# Patient Record
Sex: Male | Born: 1955 | Race: Black or African American | Hispanic: No | Marital: Single | Smoking: Current every day smoker
Health system: Southern US, Community
[De-identification: ages and names within clinical notes are randomized; demographics above are authoritative.]

## PROBLEM LIST (undated history)

## (undated) DIAGNOSIS — M1990 Unspecified osteoarthritis, unspecified site: Secondary | ICD-10-CM

## (undated) DIAGNOSIS — R011 Cardiac murmur, unspecified: Secondary | ICD-10-CM

## (undated) DIAGNOSIS — I10 Essential (primary) hypertension: Secondary | ICD-10-CM

## (undated) DIAGNOSIS — R001 Bradycardia, unspecified: Secondary | ICD-10-CM

## (undated) DIAGNOSIS — D497 Neoplasm of unspecified behavior of endocrine glands and other parts of nervous system: Secondary | ICD-10-CM

## (undated) HISTORY — PX: HERNIA REPAIR: SHX51

---

## 1997-12-16 ENCOUNTER — Emergency Department (HOSPITAL_COMMUNITY): Admission: EM | Admit: 1997-12-16 | Discharge: 1997-12-16 | Payer: Self-pay | Admitting: Emergency Medicine

## 1998-03-04 ENCOUNTER — Emergency Department (HOSPITAL_COMMUNITY): Admission: EM | Admit: 1998-03-04 | Discharge: 1998-03-04 | Payer: Self-pay | Admitting: Emergency Medicine

## 1998-03-04 ENCOUNTER — Encounter: Payer: Self-pay | Admitting: Emergency Medicine

## 1998-09-13 ENCOUNTER — Emergency Department (HOSPITAL_COMMUNITY): Admission: EM | Admit: 1998-09-13 | Discharge: 1998-09-13 | Payer: Self-pay | Admitting: Emergency Medicine

## 1999-02-17 ENCOUNTER — Emergency Department (HOSPITAL_COMMUNITY): Admission: EM | Admit: 1999-02-17 | Discharge: 1999-02-17 | Payer: Self-pay | Admitting: Emergency Medicine

## 1999-04-05 ENCOUNTER — Emergency Department (HOSPITAL_COMMUNITY): Admission: EM | Admit: 1999-04-05 | Discharge: 1999-04-05 | Payer: Self-pay | Admitting: Emergency Medicine

## 1999-05-17 ENCOUNTER — Emergency Department (HOSPITAL_COMMUNITY): Admission: EM | Admit: 1999-05-17 | Discharge: 1999-05-17 | Payer: Self-pay | Admitting: Internal Medicine

## 1999-08-30 ENCOUNTER — Emergency Department (HOSPITAL_COMMUNITY): Admission: EM | Admit: 1999-08-30 | Discharge: 1999-08-30 | Payer: Self-pay | Admitting: Emergency Medicine

## 1999-10-23 ENCOUNTER — Emergency Department (HOSPITAL_COMMUNITY): Admission: EM | Admit: 1999-10-23 | Discharge: 1999-10-23 | Payer: Self-pay | Admitting: Emergency Medicine

## 1999-12-07 ENCOUNTER — Encounter (INDEPENDENT_AMBULATORY_CARE_PROVIDER_SITE_OTHER): Payer: Self-pay | Admitting: Specialist

## 1999-12-07 ENCOUNTER — Ambulatory Visit (HOSPITAL_COMMUNITY): Admission: RE | Admit: 1999-12-07 | Discharge: 1999-12-07 | Payer: Self-pay | Admitting: Gastroenterology

## 1999-12-09 ENCOUNTER — Emergency Department (HOSPITAL_COMMUNITY): Admission: EM | Admit: 1999-12-09 | Discharge: 1999-12-09 | Payer: Self-pay | Admitting: Emergency Medicine

## 2000-02-22 ENCOUNTER — Emergency Department (HOSPITAL_COMMUNITY): Admission: EM | Admit: 2000-02-22 | Discharge: 2000-02-22 | Payer: Self-pay | Admitting: Emergency Medicine

## 2001-01-08 ENCOUNTER — Emergency Department (HOSPITAL_COMMUNITY): Admission: EM | Admit: 2001-01-08 | Discharge: 2001-01-08 | Payer: Self-pay | Admitting: Emergency Medicine

## 2001-02-13 ENCOUNTER — Emergency Department (HOSPITAL_COMMUNITY): Admission: EM | Admit: 2001-02-13 | Discharge: 2001-02-13 | Payer: Self-pay | Admitting: *Deleted

## 2001-11-04 ENCOUNTER — Emergency Department (HOSPITAL_COMMUNITY): Admission: EM | Admit: 2001-11-04 | Discharge: 2001-11-04 | Payer: Self-pay | Admitting: Emergency Medicine

## 2001-11-12 ENCOUNTER — Emergency Department (HOSPITAL_COMMUNITY): Admission: EM | Admit: 2001-11-12 | Discharge: 2001-11-12 | Payer: Self-pay | Admitting: *Deleted

## 2002-01-08 ENCOUNTER — Emergency Department (HOSPITAL_COMMUNITY): Admission: EM | Admit: 2002-01-08 | Discharge: 2002-01-08 | Payer: Self-pay | Admitting: Emergency Medicine

## 2002-01-13 ENCOUNTER — Encounter: Payer: Self-pay | Admitting: Emergency Medicine

## 2002-01-13 ENCOUNTER — Emergency Department (HOSPITAL_COMMUNITY): Admission: EM | Admit: 2002-01-13 | Discharge: 2002-01-13 | Payer: Self-pay | Admitting: Emergency Medicine

## 2002-02-19 ENCOUNTER — Emergency Department (HOSPITAL_COMMUNITY): Admission: EM | Admit: 2002-02-19 | Discharge: 2002-02-20 | Payer: Self-pay | Admitting: Emergency Medicine

## 2002-02-20 ENCOUNTER — Encounter: Payer: Self-pay | Admitting: *Deleted

## 2002-04-24 ENCOUNTER — Emergency Department (HOSPITAL_COMMUNITY): Admission: EM | Admit: 2002-04-24 | Discharge: 2002-04-24 | Payer: Self-pay | Admitting: Emergency Medicine

## 2002-07-16 ENCOUNTER — Emergency Department (HOSPITAL_COMMUNITY): Admission: EM | Admit: 2002-07-16 | Discharge: 2002-07-16 | Payer: Self-pay | Admitting: Emergency Medicine

## 2002-07-16 ENCOUNTER — Encounter: Payer: Self-pay | Admitting: Emergency Medicine

## 2002-08-05 ENCOUNTER — Emergency Department (HOSPITAL_COMMUNITY): Admission: EM | Admit: 2002-08-05 | Discharge: 2002-08-05 | Payer: Self-pay | Admitting: Emergency Medicine

## 2002-08-05 ENCOUNTER — Encounter: Payer: Self-pay | Admitting: Emergency Medicine

## 2002-08-06 ENCOUNTER — Emergency Department (HOSPITAL_COMMUNITY): Admission: EM | Admit: 2002-08-06 | Discharge: 2002-08-06 | Payer: Self-pay | Admitting: Emergency Medicine

## 2002-09-26 ENCOUNTER — Emergency Department (HOSPITAL_COMMUNITY): Admission: EM | Admit: 2002-09-26 | Discharge: 2002-09-26 | Payer: Self-pay | Admitting: Emergency Medicine

## 2003-01-08 ENCOUNTER — Emergency Department (HOSPITAL_COMMUNITY): Admission: EM | Admit: 2003-01-08 | Discharge: 2003-01-08 | Payer: Self-pay | Admitting: Emergency Medicine

## 2003-03-06 ENCOUNTER — Emergency Department (HOSPITAL_COMMUNITY): Admission: EM | Admit: 2003-03-06 | Discharge: 2003-03-06 | Payer: Self-pay | Admitting: *Deleted

## 2003-03-30 ENCOUNTER — Emergency Department (HOSPITAL_COMMUNITY): Admission: AD | Admit: 2003-03-30 | Discharge: 2003-03-30 | Payer: Self-pay | Admitting: Family Medicine

## 2003-04-24 ENCOUNTER — Emergency Department (HOSPITAL_COMMUNITY): Admission: EM | Admit: 2003-04-24 | Discharge: 2003-04-24 | Payer: Self-pay | Admitting: Family Medicine

## 2003-05-05 ENCOUNTER — Emergency Department (HOSPITAL_COMMUNITY): Admission: EM | Admit: 2003-05-05 | Discharge: 2003-05-05 | Payer: Self-pay | Admitting: Emergency Medicine

## 2003-11-14 ENCOUNTER — Emergency Department (HOSPITAL_COMMUNITY): Admission: EM | Admit: 2003-11-14 | Discharge: 2003-11-14 | Payer: Self-pay | Admitting: Emergency Medicine

## 2004-06-12 ENCOUNTER — Emergency Department (HOSPITAL_COMMUNITY): Admission: EM | Admit: 2004-06-12 | Discharge: 2004-06-12 | Payer: Self-pay | Admitting: Emergency Medicine

## 2004-10-06 ENCOUNTER — Emergency Department (HOSPITAL_COMMUNITY): Admission: EM | Admit: 2004-10-06 | Discharge: 2004-10-06 | Payer: Self-pay | Admitting: Emergency Medicine

## 2005-01-11 ENCOUNTER — Emergency Department (HOSPITAL_COMMUNITY): Admission: EM | Admit: 2005-01-11 | Discharge: 2005-01-11 | Payer: Self-pay | Admitting: Emergency Medicine

## 2005-01-19 ENCOUNTER — Emergency Department (HOSPITAL_COMMUNITY): Admission: EM | Admit: 2005-01-19 | Discharge: 2005-01-19 | Payer: Self-pay | Admitting: Emergency Medicine

## 2005-01-23 ENCOUNTER — Emergency Department (HOSPITAL_COMMUNITY): Admission: EM | Admit: 2005-01-23 | Discharge: 2005-01-23 | Payer: Self-pay | Admitting: Emergency Medicine

## 2005-02-05 ENCOUNTER — Emergency Department (HOSPITAL_COMMUNITY): Admission: EM | Admit: 2005-02-05 | Discharge: 2005-02-05 | Payer: Self-pay | Admitting: Emergency Medicine

## 2005-03-21 ENCOUNTER — Emergency Department (HOSPITAL_COMMUNITY): Admission: EM | Admit: 2005-03-21 | Discharge: 2005-03-21 | Payer: Self-pay | Admitting: Emergency Medicine

## 2005-03-23 ENCOUNTER — Emergency Department (HOSPITAL_COMMUNITY): Admission: EM | Admit: 2005-03-23 | Discharge: 2005-03-23 | Payer: Self-pay | Admitting: Emergency Medicine

## 2005-06-25 ENCOUNTER — Emergency Department (HOSPITAL_COMMUNITY): Admission: EM | Admit: 2005-06-25 | Discharge: 2005-06-25 | Payer: Self-pay | Admitting: Emergency Medicine

## 2005-07-26 ENCOUNTER — Emergency Department (HOSPITAL_COMMUNITY): Admission: EM | Admit: 2005-07-26 | Discharge: 2005-07-26 | Payer: Self-pay | Admitting: Emergency Medicine

## 2005-10-10 ENCOUNTER — Emergency Department (HOSPITAL_COMMUNITY): Admission: EM | Admit: 2005-10-10 | Discharge: 2005-10-10 | Payer: Self-pay | Admitting: Emergency Medicine

## 2005-11-23 ENCOUNTER — Ambulatory Visit: Payer: Self-pay | Admitting: Family Medicine

## 2005-11-24 ENCOUNTER — Ambulatory Visit: Payer: Self-pay | Admitting: *Deleted

## 2005-12-30 ENCOUNTER — Emergency Department (HOSPITAL_COMMUNITY): Admission: EM | Admit: 2005-12-30 | Discharge: 2005-12-30 | Payer: Self-pay | Admitting: Emergency Medicine

## 2006-01-08 ENCOUNTER — Emergency Department (HOSPITAL_COMMUNITY): Admission: EM | Admit: 2006-01-08 | Discharge: 2006-01-08 | Payer: Self-pay | Admitting: Emergency Medicine

## 2006-04-30 ENCOUNTER — Emergency Department (HOSPITAL_COMMUNITY): Admission: EM | Admit: 2006-04-30 | Discharge: 2006-04-30 | Payer: Self-pay | Admitting: Emergency Medicine

## 2006-05-03 ENCOUNTER — Emergency Department (HOSPITAL_COMMUNITY): Admission: EM | Admit: 2006-05-03 | Discharge: 2006-05-03 | Payer: Self-pay | Admitting: Emergency Medicine

## 2006-06-17 ENCOUNTER — Emergency Department (HOSPITAL_COMMUNITY): Admission: EM | Admit: 2006-06-17 | Discharge: 2006-06-17 | Payer: Self-pay | Admitting: Emergency Medicine

## 2006-06-25 ENCOUNTER — Emergency Department (HOSPITAL_COMMUNITY): Admission: EM | Admit: 2006-06-25 | Discharge: 2006-06-25 | Payer: Self-pay | Admitting: Emergency Medicine

## 2006-10-08 ENCOUNTER — Emergency Department (HOSPITAL_COMMUNITY): Admission: EM | Admit: 2006-10-08 | Discharge: 2006-10-08 | Payer: Self-pay | Admitting: Emergency Medicine

## 2006-11-29 ENCOUNTER — Emergency Department (HOSPITAL_COMMUNITY): Admission: EM | Admit: 2006-11-29 | Discharge: 2006-11-29 | Payer: Self-pay | Admitting: Emergency Medicine

## 2007-01-14 ENCOUNTER — Emergency Department (HOSPITAL_COMMUNITY): Admission: EM | Admit: 2007-01-14 | Discharge: 2007-01-14 | Payer: Self-pay | Admitting: Emergency Medicine

## 2007-01-22 ENCOUNTER — Emergency Department (HOSPITAL_COMMUNITY): Admission: EM | Admit: 2007-01-22 | Discharge: 2007-01-22 | Payer: Self-pay | Admitting: Emergency Medicine

## 2007-02-10 ENCOUNTER — Emergency Department (HOSPITAL_COMMUNITY): Admission: EM | Admit: 2007-02-10 | Discharge: 2007-02-10 | Payer: Self-pay | Admitting: Emergency Medicine

## 2007-02-23 ENCOUNTER — Ambulatory Visit: Payer: Self-pay | Admitting: Internal Medicine

## 2007-03-16 ENCOUNTER — Emergency Department (HOSPITAL_COMMUNITY): Admission: EM | Admit: 2007-03-16 | Discharge: 2007-03-16 | Payer: Self-pay | Admitting: Emergency Medicine

## 2007-03-22 ENCOUNTER — Ambulatory Visit: Payer: Self-pay | Admitting: Internal Medicine

## 2007-03-28 ENCOUNTER — Emergency Department (HOSPITAL_COMMUNITY): Admission: EM | Admit: 2007-03-28 | Discharge: 2007-03-28 | Payer: Self-pay | Admitting: Emergency Medicine

## 2007-03-29 ENCOUNTER — Ambulatory Visit: Payer: Self-pay | Admitting: Family Medicine

## 2007-03-29 LAB — CONVERTED CEMR LAB
BUN: 13 mg/dL (ref 6–23)
Calcium: 9.7 mg/dL (ref 8.4–10.5)
Creatinine, Ser: 0.82 mg/dL (ref 0.40–1.50)
Glucose, Bld: 114 mg/dL — ABNORMAL HIGH (ref 70–99)
Uric Acid, Serum: 5.2 mg/dL (ref 4.0–7.8)

## 2007-04-03 ENCOUNTER — Emergency Department (HOSPITAL_COMMUNITY): Admission: EM | Admit: 2007-04-03 | Discharge: 2007-04-03 | Payer: Self-pay | Admitting: Emergency Medicine

## 2007-04-16 ENCOUNTER — Emergency Department (HOSPITAL_COMMUNITY): Admission: EM | Admit: 2007-04-16 | Discharge: 2007-04-16 | Payer: Self-pay | Admitting: Emergency Medicine

## 2007-04-20 ENCOUNTER — Ambulatory Visit: Payer: Self-pay | Admitting: Family Medicine

## 2007-05-02 ENCOUNTER — Emergency Department (HOSPITAL_COMMUNITY): Admission: EM | Admit: 2007-05-02 | Discharge: 2007-05-02 | Payer: Self-pay | Admitting: Emergency Medicine

## 2007-05-10 ENCOUNTER — Ambulatory Visit: Payer: Self-pay | Admitting: Family Medicine

## 2007-06-03 ENCOUNTER — Emergency Department (HOSPITAL_COMMUNITY): Admission: EM | Admit: 2007-06-03 | Discharge: 2007-06-03 | Payer: Self-pay | Admitting: Emergency Medicine

## 2007-06-15 ENCOUNTER — Ambulatory Visit: Payer: Self-pay | Admitting: Family Medicine

## 2007-07-13 ENCOUNTER — Ambulatory Visit: Payer: Self-pay | Admitting: Internal Medicine

## 2007-07-27 ENCOUNTER — Encounter (INDEPENDENT_AMBULATORY_CARE_PROVIDER_SITE_OTHER): Payer: Self-pay | Admitting: Family Medicine

## 2007-07-27 ENCOUNTER — Ambulatory Visit: Payer: Self-pay | Admitting: Internal Medicine

## 2007-07-27 LAB — CONVERTED CEMR LAB
Marijuana Metabolite: NEGATIVE
Methadone: NEGATIVE
Opiate Screen, Urine: POSITIVE — AB
Propoxyphene: NEGATIVE

## 2007-08-14 ENCOUNTER — Ambulatory Visit: Payer: Self-pay | Admitting: Internal Medicine

## 2007-08-19 ENCOUNTER — Emergency Department (HOSPITAL_COMMUNITY): Admission: EM | Admit: 2007-08-19 | Discharge: 2007-08-19 | Payer: Self-pay | Admitting: Emergency Medicine

## 2007-11-13 ENCOUNTER — Ambulatory Visit: Payer: Self-pay | Admitting: Internal Medicine

## 2007-11-13 ENCOUNTER — Encounter (INDEPENDENT_AMBULATORY_CARE_PROVIDER_SITE_OTHER): Payer: Self-pay | Admitting: Family Medicine

## 2007-11-13 LAB — CONVERTED CEMR LAB
Barbiturate Quant, Ur: NEGATIVE
Creatinine,U: 192.9 mg/dL
Opiate Screen, Urine: POSITIVE — AB
Propoxyphene: NEGATIVE

## 2008-01-22 ENCOUNTER — Ambulatory Visit: Payer: Self-pay | Admitting: Family Medicine

## 2008-01-22 ENCOUNTER — Ambulatory Visit (HOSPITAL_COMMUNITY): Admission: RE | Admit: 2008-01-22 | Discharge: 2008-01-22 | Payer: Self-pay | Admitting: Family Medicine

## 2008-02-18 ENCOUNTER — Ambulatory Visit: Payer: Self-pay | Admitting: Internal Medicine

## 2008-05-02 ENCOUNTER — Ambulatory Visit: Payer: Self-pay | Admitting: Family Medicine

## 2008-07-04 ENCOUNTER — Ambulatory Visit: Payer: Self-pay | Admitting: Family Medicine

## 2008-08-28 ENCOUNTER — Ambulatory Visit: Payer: Self-pay | Admitting: Family Medicine

## 2008-09-09 ENCOUNTER — Ambulatory Visit: Payer: Self-pay | Admitting: Family Medicine

## 2008-10-16 ENCOUNTER — Emergency Department (HOSPITAL_COMMUNITY): Admission: EM | Admit: 2008-10-16 | Discharge: 2008-10-16 | Payer: Self-pay | Admitting: Emergency Medicine

## 2008-11-03 ENCOUNTER — Encounter: Admission: RE | Admit: 2008-11-03 | Discharge: 2008-11-03 | Payer: Self-pay | Admitting: Surgery

## 2008-11-04 ENCOUNTER — Ambulatory Visit (HOSPITAL_BASED_OUTPATIENT_CLINIC_OR_DEPARTMENT_OTHER): Admission: RE | Admit: 2008-11-04 | Discharge: 2008-11-04 | Payer: Self-pay | Admitting: General Surgery

## 2009-04-03 ENCOUNTER — Ambulatory Visit: Payer: Self-pay | Admitting: Family Medicine

## 2009-09-15 ENCOUNTER — Ambulatory Visit: Payer: Self-pay | Admitting: Family Medicine

## 2009-09-30 ENCOUNTER — Emergency Department (HOSPITAL_COMMUNITY): Admission: EM | Admit: 2009-09-30 | Discharge: 2009-09-30 | Payer: Self-pay | Admitting: Emergency Medicine

## 2010-01-29 ENCOUNTER — Encounter (INDEPENDENT_AMBULATORY_CARE_PROVIDER_SITE_OTHER): Payer: Self-pay | Admitting: Family Medicine

## 2010-01-29 LAB — CONVERTED CEMR LAB
ALT: 23 units/L (ref 0–53)
AST: 29 units/L (ref 0–37)
Alkaline Phosphatase: 63 units/L (ref 39–117)
Basophils Absolute: 0 10*3/uL (ref 0.0–0.1)
Basophils Relative: 0 % (ref 0–1)
CO2: 26 meq/L (ref 19–32)
Cholesterol: 218 mg/dL — ABNORMAL HIGH (ref 0–200)
Eosinophils Relative: 1 % (ref 0–5)
HCT: 42 % (ref 39.0–52.0)
LDL Cholesterol: 147 mg/dL — ABNORMAL HIGH (ref 0–99)
Lymphocytes Relative: 41 % (ref 12–46)
MCHC: 32.9 g/dL (ref 30.0–36.0)
PSA: 0.68 ng/mL (ref <=4.00)
Platelets: 312 10*3/uL (ref 150–400)
RDW: 14 % (ref 11.5–15.5)
Sodium: 140 meq/L (ref 135–145)
Total Bilirubin: 0.3 mg/dL (ref 0.3–1.2)
Total CHOL/HDL Ratio: 4.5
Total Protein: 7.8 g/dL (ref 6.0–8.3)
VLDL: 23 mg/dL (ref 0–40)

## 2010-03-22 ENCOUNTER — Emergency Department (HOSPITAL_COMMUNITY)
Admission: EM | Admit: 2010-03-22 | Discharge: 2010-03-22 | Payer: Self-pay | Source: Home / Self Care | Admitting: Emergency Medicine

## 2010-04-04 ENCOUNTER — Encounter: Payer: Self-pay | Admitting: Family Medicine

## 2010-06-09 ENCOUNTER — Emergency Department (HOSPITAL_COMMUNITY)
Admission: EM | Admit: 2010-06-09 | Discharge: 2010-06-09 | Disposition: A | Discharge status: Home / Self Care | Payer: Self-pay | Attending: Emergency Medicine | Admitting: Emergency Medicine

## 2010-06-09 DIAGNOSIS — I10 Essential (primary) hypertension: Secondary | ICD-10-CM | POA: Insufficient documentation

## 2010-06-09 DIAGNOSIS — K029 Dental caries, unspecified: Secondary | ICD-10-CM | POA: Insufficient documentation

## 2010-06-09 DIAGNOSIS — F17200 Nicotine dependence, unspecified, uncomplicated: Secondary | ICD-10-CM | POA: Insufficient documentation

## 2010-06-09 DIAGNOSIS — K089 Disorder of teeth and supporting structures, unspecified: Secondary | ICD-10-CM | POA: Insufficient documentation

## 2010-06-19 LAB — CBC
MCV: 91 fL (ref 78.0–100.0)
Platelets: 333 10*3/uL (ref 150–400)
RBC: 4.01 MIL/uL — ABNORMAL LOW (ref 4.22–5.81)
WBC: 8.9 10*3/uL (ref 4.0–10.5)

## 2010-06-19 LAB — DIFFERENTIAL
Basophils Relative: 0 % (ref 0–1)
Eosinophils Absolute: 0.1 10*3/uL (ref 0.0–0.7)
Lymphs Abs: 2.3 10*3/uL (ref 0.7–4.0)
Monocytes Relative: 6 % (ref 3–12)
Neutro Abs: 5.9 10*3/uL (ref 1.7–7.7)
Neutrophils Relative %: 66 % (ref 43–77)

## 2010-06-19 LAB — URINALYSIS, ROUTINE W REFLEX MICROSCOPIC
Ketones, ur: NEGATIVE mg/dL
Nitrite: NEGATIVE
Protein, ur: NEGATIVE mg/dL
Urobilinogen, UA: 1 mg/dL (ref 0.0–1.0)

## 2010-06-19 LAB — BASIC METABOLIC PANEL
BUN: 8 mg/dL (ref 6–23)
Calcium: 9.6 mg/dL (ref 8.4–10.5)
Creatinine, Ser: 0.63 mg/dL (ref 0.4–1.5)
GFR calc Af Amer: >60 mL/min (ref >60)

## 2010-06-19 LAB — POCT HEMOGLOBIN-HEMACUE: Hemoglobin: 13.1 g/dL (ref 13.0–17.0)
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
## 2010-07-27 NOTE — Op Note (Signed)
NAMEMARLAND, [name]                  ACCOUNT NO.:  [id number]   MEDICAL RECORD NO.:  [id number]          PATIENT TYPE:  AMB   LOCATION:  DSC                          FACILITY:  [facility]   PHYSICIAN:  Almond Lint, MD       DATE OF BIRTH:  28-Sep-1955   DATE OF PROCEDURE:  11/04/2008  DATE OF DISCHARGE:  10/16/2008                               OPERATIVE REPORT   PREOPERATIVE DIAGNOSIS:  Umbilical hernia.   POSTOPERATIVE DIAGNOSIS:  Umbilical hernia.   PROCEDURE PERFORMED:  Umbilical hernia repair with mesh.   SURGEON:  Almond Lint, MD   ASSISTANT:  None.   ANESTHESIA:  General and local.   FINDINGS:  A 1-cm defect with sac containing omentum.   SPECIMENS:  None.   ESTIMATED BLOOD LOSS:  Minimal.   COMPLICATIONS:  None known.   PROCEDURE:  [name] was identified in the holding area and taken to the  operating room where he was placed supine on the operating room table.  General anesthesia was induced.  His abdomen was clipped, prepped and  draped in a sterile fashion.  Time-out was performed according to the  surgical safety check list.  When all was correct, we continued.  The  infraumbilical skin was anesthetized with 0.25% Marcaine with  epinephrine mixed with 1% lidocaine plain.  An infraumbilical incision  was made in a curvilinear fashion.  The subcutaneous tissue was then  divided with the Bovie electrocautery.  The umbilical skin was elevated  with a tonsil.  Sharp dissection was used to take the hernia sac off the  umbilicus.  The index finger was used to pass around the hernia sac;  this was used to facilitate dissection.  Once the umbilical skin was  completely removed off of the hernia sac, the hernia was reduced back  into the abdomen and Kochers were used to elevate the umbilical fascia.  The defect was extremely small and had to be opened a little bit in  order to place the mesh.  The fascia was cleaned off on all sides at  least a 1.5 cm to 2 cm.  The small  Ventralex mesh was used to reinforce  the defect.  A 0 Prolene sutures were placed at the both lateral aspects  of the defect as well as the superior and inferior border of the defect.  Stitches were placed in between these for reinforcement.  These were all  placed x8 total.  They were pulled out to make sure that the mesh laid  flat before tying them.  The tails of the Ventralex mesh were then cut  off, and the umbilical fascia closed over the top of the mesh with a 0  Vicryl in a running fashion.  The wound was then irrigated copiously.  The umbilicus was tacked back  down to the fascia with a 4-0 Vicryl.  The skin was then closed using 3-  0 Vicryl deep dermal sutures and 4-0 Monocryl in a running subcuticular  fashion.  The skin was then cleaned, dried, and dressed with Dermabond.  The patient was  awakened from anesthesia and taken to the PACU in stable  condition.      Almond Lint, MD  Electronically Signed     FB/MEDQ  D:  11/04/2008  T:  11/05/2008  Job:  [id number]

## 2010-07-30 NOTE — Procedures (Signed)
[facility]  Patient:    [name], [name]                           MRN: [id number] Proc. Date: 12/07/99 Adm. Date:  [id number] Attending:  Orland Mustard CC:         Vikki Ports, M.D.   Procedure Report  PROCEDURE:  Colonoscopy with polypectomy.  MEDICATIONS:  Fentanyl 100 mcg, Versed 10 mg IV.  SCOPE:  Adult Olympus video colonoscope.  DESCRIPTION OF PROCEDURE:  The procedure had been explained to the patient and consent obtained. With the patient in the left lateral decubitus position, the adult Olympus video colonoscope was inserted and advanced under direct vision. The prep was excellent. Immediately upon entering, a cherry red small polyp was seen in the rectum as well as what could be AVMs or other irritated areas for unclear reasons. We were able to advance rapidly to the cecum. The ileocecal valve was seen. The right colon was somewhat dirty. The patient had had the Fleets prep. The scope was withdrawn. No gross lesions were seen in the cecum, ascending colon, hepatic flexure, transverse colon, splenic flexure, descending and sigmoid colon. Again in the rectum, a 1/2 cm sessile polyp was encountered and this was removed with the snare and sucked through the scope. There was no bleeding. The scope was withdrawn and the patient tolerated the procedure well.  ASSESSMENT: 1. Rectal polyp removed. 2. Possible AVMs in the cecum but not clear this could have been scope trauma.  PLAN:  Will check path. Routine post polypectomy instructions. Will see back in the office in 4-6 weeks. DD:  12/07/99 TD:  12/07/99 Job: [id number]

## 2010-08-08 ENCOUNTER — Emergency Department (HOSPITAL_COMMUNITY): Payer: Self-pay

## 2010-08-08 ENCOUNTER — Emergency Department (HOSPITAL_COMMUNITY)
Admission: EM | Admit: 2010-08-08 | Discharge: 2010-08-08 | Discharge status: Against Medical Advice | Payer: Self-pay | Attending: Emergency Medicine | Admitting: Emergency Medicine

## 2010-08-08 DIAGNOSIS — R0602 Shortness of breath: Secondary | ICD-10-CM | POA: Insufficient documentation

## 2010-08-08 DIAGNOSIS — R079 Chest pain, unspecified: Secondary | ICD-10-CM | POA: Insufficient documentation

## 2010-08-08 DIAGNOSIS — R9431 Abnormal electrocardiogram [ECG] [EKG]: Secondary | ICD-10-CM | POA: Insufficient documentation

## 2010-08-08 DIAGNOSIS — I10 Essential (primary) hypertension: Secondary | ICD-10-CM | POA: Insufficient documentation

## 2010-08-08 DIAGNOSIS — I498 Other specified cardiac arrhythmias: Secondary | ICD-10-CM | POA: Insufficient documentation

## 2010-08-08 LAB — COMPREHENSIVE METABOLIC PANEL
Albumin: 4.4 g/dL (ref 3.5–5.2)
Alkaline Phosphatase: 73 U/L (ref 39–117)
BUN: 7 mg/dL (ref 6–23)
Chloride: 101 mEq/L (ref 96–112)
Glucose, Bld: 99 mg/dL (ref 70–99)
Potassium: 3.4 mEq/L — ABNORMAL LOW (ref 3.5–5.1)
Total Bilirubin: 0.3 mg/dL (ref 0.3–1.2)

## 2010-08-08 LAB — DIFFERENTIAL
Basophils Relative: 0 % (ref 0–1)
Eosinophils Absolute: 0.3 10*3/uL (ref 0.0–0.7)
Lymphs Abs: 3.4 10*3/uL (ref 0.7–4.0)
Monocytes Relative: 10 % (ref 3–12)
Neutro Abs: 5.4 10*3/uL (ref 1.7–7.7)
Neutrophils Relative %: 53 % (ref 43–77)

## 2010-08-08 LAB — CBC
HCT: 36.3 % — ABNORMAL LOW (ref 39.0–52.0)
MCV: 85 fL (ref 78.0–100.0)
Platelets: 266 10*3/uL (ref 150–400)
RBC: 4.27 MIL/uL (ref 4.22–5.81)
WBC: 10.1 10*3/uL (ref 4.0–10.5)

## 2010-08-08 LAB — CK TOTAL AND CKMB (NOT AT ARMC): CK, MB: 6.4 ng/mL (ref 0.3–4.0)

## 2010-08-09 ENCOUNTER — Emergency Department (HOSPITAL_COMMUNITY): Payer: Self-pay

## 2010-08-09 ENCOUNTER — Inpatient Hospital Stay (HOSPITAL_COMMUNITY)
Admission: EM | Admit: 2010-08-09 | Discharge: 2010-08-10 | DRG: 313 | Discharge status: Against Medical Advice | Payer: Self-pay | Attending: Internal Medicine | Admitting: Internal Medicine

## 2010-08-09 DIAGNOSIS — I10 Essential (primary) hypertension: Secondary | ICD-10-CM | POA: Diagnosis present

## 2010-08-09 DIAGNOSIS — R0789 Other chest pain: Principal | ICD-10-CM | POA: Diagnosis present

## 2010-08-09 DIAGNOSIS — F17200 Nicotine dependence, unspecified, uncomplicated: Secondary | ICD-10-CM | POA: Diagnosis present

## 2010-08-09 DIAGNOSIS — I498 Other specified cardiac arrhythmias: Secondary | ICD-10-CM

## 2010-08-09 DIAGNOSIS — R9431 Abnormal electrocardiogram [ECG] [EKG]: Secondary | ICD-10-CM | POA: Diagnosis present

## 2010-08-09 LAB — BASIC METABOLIC PANEL
Calcium: 9 mg/dL (ref 8.4–10.5)
GFR calc Af Amer: >60 mL/min (ref >60)
GFR calc non Af Amer: >60 mL/min (ref >60)
Potassium: 3 mEq/L — ABNORMAL LOW (ref 3.5–5.1)
Sodium: 138 mEq/L (ref 135–145)

## 2010-08-09 LAB — DIFFERENTIAL
Basophils Relative: 0 % (ref 0–1)
Lymphs Abs: 3.2 10*3/uL (ref 0.7–4.0)
Monocytes Absolute: 0.8 10*3/uL (ref 0.1–1.0)
Monocytes Relative: 10 % (ref 3–12)
Neutro Abs: 4.3 10*3/uL (ref 1.7–7.7)

## 2010-08-09 LAB — CK TOTAL AND CKMB (NOT AT ARMC)
CK, MB: 4.9 ng/mL — ABNORMAL HIGH (ref 0.3–4.0)
Relative Index: 1 (ref 0.0–2.5)
Total CK: 495 U/L — ABNORMAL HIGH (ref 7–232)

## 2010-08-09 LAB — RAPID URINE DRUG SCREEN, HOSP PERFORMED
Amphetamines: NOT DETECTED
Barbiturates: NOT DETECTED
Tetrahydrocannabinol: NOT DETECTED

## 2010-08-09 LAB — TROPONIN I: Troponin I: <0.3 ng/mL (ref <0.30)

## 2010-08-09 LAB — CBC
HCT: 35.5 % — ABNORMAL LOW (ref 39.0–52.0)
Hemoglobin: 12.1 g/dL — ABNORMAL LOW (ref 13.0–17.0)
MCH: 28.9 pg (ref 26.0–34.0)
MCHC: 34.1 g/dL (ref 30.0–36.0)
MCV: 84.7 fL (ref 78.0–100.0)

## 2010-08-09 LAB — APTT: aPTT: 34 seconds (ref 24–37)

## 2010-08-10 LAB — COMPREHENSIVE METABOLIC PANEL
AST: 24 U/L (ref 0–37)
Albumin: 4 g/dL (ref 3.5–5.2)
BUN: 10 mg/dL (ref 6–23)
Chloride: 104 mEq/L (ref 96–112)
Creatinine, Ser: 0.66 mg/dL (ref 0.4–1.5)
GFR calc Af Amer: >60 mL/min (ref >60)
Total Bilirubin: 0.2 mg/dL — ABNORMAL LOW (ref 0.3–1.2)
Total Protein: 6.8 g/dL (ref 6.0–8.3)

## 2010-08-10 LAB — LIPID PANEL
Cholesterol: 182 mg/dL (ref 0–200)
HDL: 40 mg/dL (ref >39)
LDL Cholesterol: 115 mg/dL — ABNORMAL HIGH (ref 0–99)
Total CHOL/HDL Ratio: 4.6 RATIO
Triglycerides: 136 mg/dL (ref <150)
VLDL: 27 mg/dL (ref 0–40)

## 2010-08-10 LAB — CBC
HCT: 35.8 % — ABNORMAL LOW (ref 39.0–52.0)
Hemoglobin: 12.1 g/dL — ABNORMAL LOW (ref 13.0–17.0)
MCHC: 33.8 g/dL (ref 30.0–36.0)
RDW: 13.9 % (ref 11.5–15.5)
WBC: 8.8 10*3/uL (ref 4.0–10.5)

## 2010-08-10 LAB — CARDIAC PANEL(CRET KIN+CKTOT+MB+TROPI)
Relative Index: 1.1 (ref 0.0–2.5)
Total CK: 425 U/L — ABNORMAL HIGH (ref 7–232)
Troponin I: <0.3 ng/mL (ref <0.30)

## 2010-08-10 LAB — IRON AND TIBC: Iron: 50 ug/dL (ref 42–135)

## 2010-08-10 LAB — PROTIME-INR
INR: 0.97 (ref 0.00–1.49)
Prothrombin Time: 13.1 seconds (ref 11.6–15.2)

## 2010-08-10 LAB — FERRITIN: Ferritin: 142 ng/mL (ref 22–322)

## 2010-08-10 LAB — FOLATE: Folate: 11.8 ng/mL

## 2010-08-10 LAB — VITAMIN B12: Vitamin B-12: 469 pg/mL (ref 211–911)

## 2010-08-13 NOTE — Consult Note (Addendum)
[name], [name]                  ACCOUNT NO.:  [id number]  MEDICAL RECORD NO.:  [id number]           PATIENT TYPE:  O  LOCATION:  1417                         FACILITY:  [facility]  PHYSICIAN:  Colleen Can. Deborah Chalk, M.D.DATE OF BIRTH:  08/31/1955  DATE OF CONSULTATION: DATE OF DISCHARGE:  08/10/2010                                CONSULTATION   PRIMARY CARDIOLOGIST:  New, being seen by Dr. Deborah Chalk.  PRIMARY MEDICAL DOCTOR:  [facility].  CHIEF COMPLAINT:  A lump in my throat  HISTORY OF PRESENT ILLNESS:  Mr. Moxey is a very pleasant 55 year old gentleman with no prior cardiac history, except in the past he states he may have been told he had an enlarged heart but he does not know the details of this.  He does not recall ever having a stress test, heart catheterization or echocardiogram.  He was admitted with a chief complaint of a sensation of a lump in his chest.  He has had gas like discomfort for 3 days whenever he eats.  He feels it is difficult to get food to go down, almost like it gets stuck in his throat.  He also feels as a sensation of chest discomfort when he breathes and drinks water, as if something is lodged midsternally.  Since admission, it is much better on its own.  He tried Nexium as an outpatient but got no relief. Nitroglycerin here may have helped his symptoms.  He has had no exertional symptoms including chest pain, shortness of breath, nausea, vomiting or diaphoresis.  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  He has elevated CKs and MBs but negative relative index and negative troponin x3.  He has exhibited sinus bradycardia on telemetry.  His heart rate is mostly in the mid to upper 40s, occasionally dipping into the upper 30s when he sleeps.  He denies any syncope or presyncope.  PAST MEDICAL HISTORY: 1. Hypertension. 2. Tobacco abuse. 3. Possibility he has been told in the past he has an  enlarged heart     but denies any prior cardiac stress testing 4. Umbilical hernia repair. 5. Hiatal hernia surgery remotely  OUTPATIENT MEDICATIONS:  Norvasc 10 mg daily, Nexium p.r.n.  INPATIENT MEDICATIONS:  Norvasc 10 mg daily, aspirin 325 mg daily, Lovenox 40 mg daily, nicotine  patch 14 mg and Protonix 40 mg daily.  ALLERGIES:  SULFA.  SOCIAL HISTORY:  Mr. Thieme is with his fiancee.  They have several children between the 2 of them.  He works for [employer] and denies exertional symptoms.  He smokes 2 pack per day and has done so since age 70.  He denies any alcohol or illicit drug use.  FAMILY HISTORY:  His mother had diabetes, hypertension, possibly some type of kidney cancer, but also had a CVA.  She died at age 2.  His father died at age 29 of lung cancer.  He has a brother with hypertension and a sister also has hypertension.  No known coronary artery disease in family.  REVIEW OF SYSTEMS:  No fevers, chills, shortness of breath, dyspnea on exertion, edema, palpitations, nausea, vomiting, diarrhea.  All othersystems reviewed and otherwise negative except for those noted in HPI.  LABORATORY DATA:  WBC 8.8, hemoglobin 12.1, hematocrit 35.8, platelet count 251.  Sodium 139, potassium 3.9, chloride 104, CO2 of 25, glucose 107, BUN 10, creatinine 0.66.  LFTs within normal limits.  TSH within normal limits.  CK peak of 726 with MB peak of 6.4 with negative relative index, negative troponin.  BNP 62, magnesium 2.3.  Urine drug screen negative.  Total cholesterol 182, triglycerides 136, HDL 40, LDL 115.  EKG, sinus bradycardia with T-wave inversion, aVL, V2 through V6 with nonspecific intraventricular conduction delay.  He did have some T-wave abnormalities, V3 through V5 in 2010 but are more prominent today.  RADIOLOGIC STUDIES:  Chest x-ray showed chronically appearing left sixth rib fracture.  Bibasilar atelectasis.  PHYSICAL EXAMINATION:  VITAL SIGNS:  Temperature  97.9, pulse 45, respirations 16, blood pressure 146/80, pulse oximetry 93% on room air. GENERAL:  In general, this is a pleasant well-appearing fit African American male, in no acute distress. HEENT:  Normocephalic, atraumatic.  Extraocular movements intact.  Clear sclerae.  Nares without discharge. NECK:  Supple without carotid bruit or JVD. HEART:  Auscultation to the heart reveals bradycardic rate with regular rhythm with S1 and S2 without murmurs, rubs or gallops. LUNGS:  Clear to auscultation bilaterally without wheezes, rales or rhonchi. ABDOMEN:  Soft, nontender and nondistended with positive bowel sounds. EXTREMITIES:  Warm, dry and without edema.  He has 2+ pedal pulses bilaterally. NEUROLOGIC:  He is alert and oriented x3 and responds to questions appropriately with a normal affect.  ASSESSMENT AND PLAN:  The patient was seen and examined by Dr. Deborah Chalk and myself.  This is a 55 year old gentleman with a history of hypertension and tobacco abuse who denies any prior cardiac testing but states he does have a history of possibly an enlarged heart.  A 2D echocardiogram is pending.  We are asked to see him in regard to chest discomfort in the setting of bradycardia and EKG changes.  The chest pain sensation is described as more reminiscent of esophageal stricture or spasm.  He has had negative troponin x3.  EKG does have abnormalities which although may reflect changes from LVH, I felt the warrant investigation given his significant risk factors for CAD including an 80- pack year history of tobacco abuse as well as hypertension.  We agree with the plan for 2-D echocardiogram and we will order an exercise Myoview in the morning.  This will also allow [place] to evaluate his heart rate upon exercise.  For now, his sinus bradycardia should be followed as he is asymptomatic at present and blood pressure stable.  He may need an EGD as an outpatient.  Would continue aspirin and add  lisinopril for better blood pressure control as well.  The patient was also extensively counseled on smoking cessation.  We will continue to follow up with you.  Thank you for the opportunity to participate in the care of this patient.     Dayna Dunn, P.A.C.   ______________________________ Colleen Can. Deborah Chalk, M.D.    DD/MEDQ  D:  08/10/2010  T:  08/10/2010  Job:  [id number]  cc:   Colleen Can. Deborah Chalk, M.D. Fax: [phone]  Electronically Signed by Ronie Spies  on 08/13/2010 01:46:49 PM Electronically Signed by Roger Shelter M.D. on 10/11/2010 11:15:30 AM

## 2010-08-19 NOTE — H&P (Signed)
[name], [name] NO.:  [id number]  MEDICAL RECORD NO.:  [id number]           PATIENT TYPE:  E  LOCATION:  WLED                         FACILITY:  [facility]  PHYSICIAN:  Osvaldo Shipper, MD     DATE OF BIRTH:  [birth year]  DATE OF ADMISSION:  08/09/2010 DATE OF DISCHARGE:                             HISTORY & PHYSICAL   PRIMARY CARE PROVIDER:  Located in [place].  CARDIOLOGIST:  He does not have a cardiologist.  ADMISSION DIAGNOSES: 1. Chest pain, rule out acute coronary syndrome. 2. History of hypertension. 3. Sinus bradycardia. 4. Tobacco abuse.  CHIEF COMPLAINT:  Chest pain since the last 4 days.  HISTORY OF PRESENT ILLNESS:  The patient is a 55 year old African American male with a past medical history of hypertension, tobacco abuse, who was in his usual state of health about 4 to 5 days ago when he was drinking water.  He says he was swallowing very fast and he suddenly felt a knot in the center of his chest and then he had difficulty swallowing same water.  He felt a little bit short of breath, became hot and sweaty.  He had some Alka-Seltzer with no relief.  Then, he had some ginger ale with no relief.  He continued to stay at home because of the pain, did not go back to his work.  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  He also took Nexium over the weekend to try and relieve this pain.  The pain did not let up, so he decided to come into the ED yesterday.  He was given glucagon presumably for his slow heartbeat and then he was given nitroglycerin with which he had some relief of pain.  The pain was 8/10 in intensity, it is more severe and currently it is about 2/10 in intensity.  He also took some aspirin.  Did not have any palpitations.  No dizziness.  No light-headedness.  No nausea, vomiting.  No cough.  No sick contacts. No fever or chills.  Yesterday, he was recommended to be admitted, however, he had to  leave to take care of some business, so he left against medical advice.  Today, he came back because of persistent symptoms.  MEDICATIONS AT HOME: 1. Norvasc 10 mg daily. 2. Nexium as needed.  ALLERGIES:  SULFA DRUG causes itching along with nausea and vomiting.  PAST MEDICAL HISTORY:  Positive for hypertension.  PAST SURGICAL HISTORY:  Positive for hiatal hernia surgery and umbilical hernia repair.  SOCIAL HISTORY:  He lives in [place] with his fiancee.  He works for a Multimedia programmer.  He smokes 2 pack of cigarettes on a daily basis.  Denies any alcohol use.  No illicit drug use.  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  He denies any kind of chest symptoms when he does his work.  FAMILY HISTORY:  Father died of lung cancer in his 42s.  Mother has hypertension and some unknown cancer.  He has 5 siblings,  some of whom have hypertension and diabetes, but there is no history of coronary artery disease.  REVIEW OF SYSTEMS:  GENERAL SYSTEM:  Positive weakness and malaise. HEENT: Unremarkable.  CARDIOVASCULAR:  As in HPI.  RESPIRATORY:  As in HPI.  GI:  As in HPI.  GU:  Unremarkable.  NEUROLOGIC:  Unremarkable. PSYCHIATRIC:  Unremarkable.  DERMATOLOGIC:  Unremarkable.  Other systems reviewed and found to be negative.  PHYSICAL EXAMINATION:  VITAL SIGNS:  Temperature not recorded yet. Blood pressure 127/74, heart rate in the 40s and regular, respiratory rate is 16, saturation 97% on room air. GENERAL:  He is a well-developed, well-nourished [place] American male, probably mildly overweight in no distress at this time. HEENT:  Head is normocephalic, atraumatic.  Pupils are equal, reacting. No pallor.  No icterus.  Oral mucous membrane is moist.  No oral lesions noted. NECK:  Soft and supple.  No thyromegaly is appreciated.  No cervical, supraclavicular, or inguinal lymphadenopathy is present. LUNGS:  Clear to  auscultation bilaterally with no wheezing, rales, or rhonchi. CARDIOVASCULAR SYSTEM:  S1 and S2, bradycardic, regular.  No S3, S4, rubs, murmurs, or bruits.  No pedal edema. ABDOMEN:  Soft, nontender, nondistended.  Bowel sounds are present.  No masses or organomegaly is appreciated. GU:  Deferred. MUSCULOSKELETAL:  Normal muscle mass and tone.  No calf tenderness is present on either side.  No erythema noted in the lower extremities. NEUROLOGIC:  He is alert and oriented x3.  No focal neurological deficits are present. SKIN:  Does not reveal any rashes.  LABORATORY DATA:  His white cell count is 8.7, hemoglobin is 12.1 with MCV of 84.  His sodium is 138, potassium is 3.0, glucose is 102, BUN is 12, creatinine is 0.8, calcium is 9.0.  total CK is 495, CK-MB 4.9, troponin less than 0.3.  CK was 726 yesterday, CK-MB was 6.4 yesterday. His urine drug screen is negative.  Chest x-ray, he had one today and yesterday, both of them showed bibasilar atelectasis.  EKG shows a sinus bradycardia at 43 with normal axis, intervals appeared to be in the normal range.  No Q-waves.  He does have T inversion from V2 to through V6.  No ST depression or ST elevation is noted.  Previous EKG, this was compared to the EKG from yesterday and appeared to be quite similar, but these changes are not as pronounced in the EKG from 08-01-08.  ASSESSMENT:  This is a 55 year old African American male with hypertension, tobacco abuse, who presents with chest pain.  Differential diagnosis include esophageal spasm, angina, gastroesophageal reflux disease.  He does have some EKG changes as well which could be related to a LV strain.  His blood pressure seems to be reasonably well- controlled.  PLAN: 1. Chest pain.  We will rule him out for acute coronary syndrome by     serial cardiac enzymes.  He will be observed in the hospital.     Lipid panel will be checked.  TSH level will be checked.  BNP will     be checked  as well.  We will also get an echocardiogram.  EKG will     be repeated.  This patient will require stress test, so we have     already spoken to Dr. Patty Sermons about this patient.  Aspirin will     be given to him for now.  Continue with Norvasc.  He will be     monitored on telemetry. 2. Sinus bradycardia, will be  evaluated using echocardiogram, repeat     EKGs as well as TSH level. 3. Hypokalemia, will be corrected orally.  Magnesium level will be     checked. 4. Mild anemia.  We will check anemia panel in the morning. 5. Deep venous thrombosis prophylaxis will be initiated.  Further management and decisions will depend on results of further testing and the patient's response to treatment.   Osvaldo Shipper, MD     GK/MEDQ  D:  08/09/2010  T:  08/09/2010  Job:  [id number]  cc:   [facility] Fax: [phone]  Electronically Signed by Osvaldo Shipper MD on 08/19/2010 11:18:05 PM

## 2010-08-19 NOTE — Discharge Summary (Signed)
  NAMEVAUGHN, BEAUMIER NO.:  [id number]  MEDICAL RECORD NO.:  [id number]           PATIENT TYPE:  O  LOCATION:  1417                         FACILITY:  [facility]  PHYSICIAN:  Osvaldo Shipper, MD     DATE OF BIRTH:  July 02, 1955  DATE OF ADMISSION:  08/09/2010 DATE OF DISCHARGE:  08/10/2010                              DISCHARGE SUMMARY   The patient left against medical advice on Aug 10, 2010.  Please note the patient left against medical advice.  Imaging studies done during this hospitalization.  Chest x-ray which showed bibasilar atelectasis.  BRIEF HOSPITAL COURSE:  Briefly, this is a 55 year old, African American male, who presented to the hospital with chest pain was somewhat atypical for cardiac history, however, the patient did has some EKG changes in the form of T inversions.  The patient was also bradycardic, which was sinus.  He was put on PPI and was admitted for rule out.  He ruled out for acute coronary syndrome.  He was seen by Cardiology and the plan was to get a stress test on the Aug 11, 2010.  However, the patient apparently got a little bit agitated.  He was feeling frustrated about the weight and threatened initially to leave AMA.  I spoke to him and I told him that he needed to stay for a stress test for 1 more day. However, he initially told me he will stay, but then subsequently I was informed by the nurse that he decided to leave against medical advice.  So no discharge instructions, medications, etc. could be given to this patient as he left AMA.  I am hoping he will follow up with his primary care physician.   Osvaldo Shipper, MD     GK/MEDQ  D:  08/10/2010  T:  08/11/2010  Job:  [id number]  Electronically Signed by Osvaldo Shipper MD on 08/19/2010 11:18:21 PM

## 2010-10-20 ENCOUNTER — Emergency Department (HOSPITAL_COMMUNITY): Payer: Self-pay

## 2010-10-20 ENCOUNTER — Emergency Department (HOSPITAL_COMMUNITY)
Admission: EM | Admit: 2010-10-20 | Discharge: 2010-10-20 | Disposition: A | Discharge status: Home / Self Care | Payer: Self-pay | Attending: Emergency Medicine | Admitting: Emergency Medicine

## 2010-10-20 DIAGNOSIS — S81009A Unspecified open wound, unspecified knee, initial encounter: Secondary | ICD-10-CM | POA: Insufficient documentation

## 2010-10-20 DIAGNOSIS — Y990 Civilian activity done for income or pay: Secondary | ICD-10-CM | POA: Insufficient documentation

## 2010-10-20 DIAGNOSIS — S91009A Unspecified open wound, unspecified ankle, initial encounter: Secondary | ICD-10-CM | POA: Insufficient documentation

## 2010-10-20 DIAGNOSIS — I10 Essential (primary) hypertension: Secondary | ICD-10-CM | POA: Insufficient documentation

## 2010-10-20 DIAGNOSIS — Z23 Encounter for immunization: Secondary | ICD-10-CM | POA: Insufficient documentation

## 2010-10-20 DIAGNOSIS — W268XXA Contact with other sharp object(s), not elsewhere classified, initial encounter: Secondary | ICD-10-CM | POA: Insufficient documentation

## 2010-10-20 DIAGNOSIS — Y9269 Other specified industrial and construction area as the place of occurrence of the external cause: Secondary | ICD-10-CM | POA: Insufficient documentation

## 2010-10-26 ENCOUNTER — Emergency Department (HOSPITAL_COMMUNITY)
Admission: EM | Admit: 2010-10-26 | Discharge: 2010-10-26 | Discharge status: Against Medical Advice | Payer: Self-pay | Attending: Emergency Medicine | Admitting: Emergency Medicine

## 2010-10-26 DIAGNOSIS — Z0389 Encounter for observation for other suspected diseases and conditions ruled out: Secondary | ICD-10-CM | POA: Insufficient documentation

## 2010-10-27 ENCOUNTER — Emergency Department (HOSPITAL_COMMUNITY)
Admission: EM | Admit: 2010-10-27 | Discharge: 2010-10-27 | Disposition: A | Discharge status: Home / Self Care | Payer: Self-pay | Attending: Emergency Medicine | Admitting: Emergency Medicine

## 2010-10-27 DIAGNOSIS — I10 Essential (primary) hypertension: Secondary | ICD-10-CM | POA: Insufficient documentation

## 2010-10-27 DIAGNOSIS — Z79899 Other long term (current) drug therapy: Secondary | ICD-10-CM | POA: Insufficient documentation

## 2010-10-27 DIAGNOSIS — K089 Disorder of teeth and supporting structures, unspecified: Secondary | ICD-10-CM | POA: Insufficient documentation

## 2010-10-27 DIAGNOSIS — R220 Localized swelling, mass and lump, head: Secondary | ICD-10-CM | POA: Insufficient documentation

## 2010-11-04 ENCOUNTER — Emergency Department (HOSPITAL_COMMUNITY)
Admission: EM | Admit: 2010-11-04 | Discharge: 2010-11-04 | Disposition: A | Discharge status: Home / Self Care | Payer: Self-pay | Attending: Emergency Medicine | Admitting: Emergency Medicine

## 2010-11-04 DIAGNOSIS — Z4802 Encounter for removal of sutures: Secondary | ICD-10-CM | POA: Insufficient documentation

## 2010-11-10 ENCOUNTER — Emergency Department (HOSPITAL_COMMUNITY)
Admission: EM | Admit: 2010-11-10 | Discharge: 2010-11-10 | Disposition: A | Discharge status: Home / Self Care | Payer: Self-pay | Attending: Emergency Medicine | Admitting: Emergency Medicine

## 2010-11-10 DIAGNOSIS — T8131XA Disruption of external operation (surgical) wound, not elsewhere classified, initial encounter: Secondary | ICD-10-CM | POA: Insufficient documentation

## 2010-11-10 DIAGNOSIS — M79609 Pain in unspecified limb: Secondary | ICD-10-CM | POA: Insufficient documentation

## 2010-11-10 DIAGNOSIS — Y849 Medical procedure, unspecified as the cause of abnormal reaction of the patient, or of later complication, without mention of misadventure at the time of the procedure: Secondary | ICD-10-CM | POA: Insufficient documentation

## 2010-11-10 DIAGNOSIS — Z09 Encounter for follow-up examination after completed treatment for conditions other than malignant neoplasm: Secondary | ICD-10-CM | POA: Insufficient documentation

## 2010-11-10 DIAGNOSIS — I10 Essential (primary) hypertension: Secondary | ICD-10-CM | POA: Insufficient documentation

## 2011-07-15 ENCOUNTER — Other Ambulatory Visit (HOSPITAL_COMMUNITY): Payer: Self-pay | Admitting: Family Medicine

## 2011-07-15 DIAGNOSIS — M25562 Pain in left knee: Secondary | ICD-10-CM

## 2011-07-19 ENCOUNTER — Other Ambulatory Visit (HOSPITAL_COMMUNITY): Payer: Self-pay

## 2011-07-20 ENCOUNTER — Other Ambulatory Visit (HOSPITAL_COMMUNITY): Payer: Self-pay | Admitting: Family Medicine

## 2011-07-20 DIAGNOSIS — M25562 Pain in left knee: Secondary | ICD-10-CM

## 2011-07-22 ENCOUNTER — Ambulatory Visit (HOSPITAL_COMMUNITY)
Admission: RE | Admit: 2011-07-22 | Discharge: 2011-07-22 | Disposition: A | Discharge status: Home / Self Care | Payer: Medicaid Other | Source: Ambulatory Visit | Attending: Family Medicine | Admitting: Family Medicine

## 2011-07-22 DIAGNOSIS — M9420 Chondromalacia, unspecified site: Secondary | ICD-10-CM | POA: Insufficient documentation

## 2011-07-22 DIAGNOSIS — M23302 Other meniscus derangements, unspecified lateral meniscus, unspecified knee: Secondary | ICD-10-CM | POA: Insufficient documentation

## 2011-07-22 DIAGNOSIS — M7120 Synovial cyst of popliteal space [Baker], unspecified knee: Secondary | ICD-10-CM | POA: Insufficient documentation

## 2011-07-22 DIAGNOSIS — M25562 Pain in left knee: Secondary | ICD-10-CM

## 2011-07-22 DIAGNOSIS — R29898 Other symptoms and signs involving the musculoskeletal system: Secondary | ICD-10-CM | POA: Insufficient documentation

## 2011-11-13 ENCOUNTER — Encounter (HOSPITAL_COMMUNITY): Payer: Self-pay | Admitting: Emergency Medicine

## 2011-11-13 ENCOUNTER — Emergency Department (HOSPITAL_COMMUNITY)
Admission: EM | Admit: 2011-11-13 | Discharge: 2011-11-13 | Disposition: A | Discharge status: Home / Self Care | Payer: Medicaid Other | Attending: Emergency Medicine | Admitting: Emergency Medicine

## 2011-11-13 DIAGNOSIS — I10 Essential (primary) hypertension: Secondary | ICD-10-CM | POA: Insufficient documentation

## 2011-11-13 DIAGNOSIS — K089 Disorder of teeth and supporting structures, unspecified: Secondary | ICD-10-CM

## 2011-11-13 DIAGNOSIS — Z882 Allergy status to sulfonamides status: Secondary | ICD-10-CM | POA: Insufficient documentation

## 2011-11-13 DIAGNOSIS — K029 Dental caries, unspecified: Secondary | ICD-10-CM | POA: Insufficient documentation

## 2011-11-13 DIAGNOSIS — G8929 Other chronic pain: Secondary | ICD-10-CM | POA: Insufficient documentation

## 2011-11-13 DIAGNOSIS — F17200 Nicotine dependence, unspecified, uncomplicated: Secondary | ICD-10-CM | POA: Insufficient documentation

## 2011-11-13 HISTORY — DX: Essential (primary) hypertension: I10

## 2011-11-13 MED ORDER — OXYCODONE-ACETAMINOPHEN 5-325 MG PO TABS: ORAL_TABLET | ORAL | Status: AC

## 2011-11-13 MED ORDER — PENICILLIN V POTASSIUM 500 MG PO TABS: 500.0000 mg | ORAL_TABLET | Freq: Four times a day (QID) | ORAL | Status: AC

## 2011-11-13 NOTE — ED Notes (Signed)
Pt presents w/ tooth pain, upper right rear molar. Pain started last noc after eating dinner. Rx'ed w/ ibuprofen which did help w/ pain briefly

## 2011-11-13 NOTE — ED Provider Notes (Signed)
History     CSN: [id number]  Arrival date & time 11/13/11  1035   First MD Initiated Contact with Patient 11/13/11 1225      Chief Complaint  Patient presents with  . Dental Pain    (Consider location/radiation/quality/duration/timing/severity/associated sxs/prior treatment) The history is provided by the patient.   56 y.o. male in no acute distress complaining of tooth pain to the right upper jaw. Worsening over the course of several days he's had a history for several years however. He has taken ibuprofen with no relief pain is 10 out of 10 and exacerbated by chewing and movement. He denies any fever, nausea vomiting, or change in vision.  Past Medical History  Diagnosis Date  . Hypertension     History reviewed. No pertinent past surgical history.  No family history on file.  History  Substance Use Topics  . Smoking status: Current Everyday Smoker -- 1.0 packs/day  . Smokeless tobacco: Never Used  . Alcohol Use: No      Review of Systems  Constitutional: Negative for fever.  Respiratory: Negative for shortness of breath.   Cardiovascular: Negative for chest pain.  Gastrointestinal: Negative for nausea, vomiting, abdominal pain and diarrhea.  All other systems reviewed and are negative.    Allergies  Sulfa antibiotics  Home Medications   Current Outpatient Rx  Name Route Sig Dispense Refill  . AMLODIPINE BESYLATE 10 MG PO TABS Oral Take 10 mg by mouth daily.    . OXYCODONE-ACETAMINOPHEN 5-325 MG PO TABS  1 to 2 tabs PO q6hrs  PRN for pain 15 tablet 0  . PENICILLIN V POTASSIUM 500 MG PO TABS Oral Take 1 tablet (500 mg total) by mouth 4 (four) times daily. 40 tablet 0    BP 138/90  Pulse 57  Temp 98.3 F (36.8 C) (Oral)  Resp 18  SpO2 99%  Physical Exam  Nursing note and vitals reviewed. Constitutional: He is oriented to person, place, and time. He appears well-developed and well-nourished. No distress.  HENT:  Head: Normocephalic.       Multiple  dental caries. No fluctuance induration to gum line or cheek appreciated.  Eyes: Conjunctivae and EOM are normal.  Cardiovascular: Normal rate.   Pulmonary/Chest: Effort normal.  Musculoskeletal: Normal range of motion.  Neurological: He is alert and oriented to person, place, and time.  Psychiatric: He has a normal mood and affect.    ED Course  Procedures (including critical care time)  Labs Reviewed - No data to display No results found.   1. Chronic dental pain       MDM  Exacerbation of chronic dental pain without evidence of infection. Will start patient on penicillin VK 2 prophylax against him a referral to a dentist and a resource guide.  New Prescriptions   OXYCODONE-ACETAMINOPHEN (PERCOCET/ROXICET) 5-325 MG PER TABLET    1 to 2 tabs PO q6hrs  PRN for pain   PENICILLIN V POTASSIUM (VEETID) 500 MG TABLET    Take 1 tablet (500 mg total) by mouth 4 (four) times daily.          Wynetta Emery, PA-C 11/13/11 2030

## 2011-11-21 NOTE — ED Provider Notes (Signed)
Medical screening examination/treatment/procedure(s) were performed by non-physician practitioner and as supervising physician I was immediately available for consultation/collaboration.   Loren Racer, MD 11/21/11 [phone]

## 2012-01-08 ENCOUNTER — Emergency Department (HOSPITAL_COMMUNITY)
Admission: EM | Admit: 2012-01-08 | Discharge: 2012-01-08 | Disposition: A | Discharge status: Home / Self Care | Payer: Medicaid Other | Attending: Emergency Medicine | Admitting: Emergency Medicine

## 2012-01-08 ENCOUNTER — Encounter (HOSPITAL_COMMUNITY): Payer: Self-pay | Admitting: *Deleted

## 2012-01-08 DIAGNOSIS — F17200 Nicotine dependence, unspecified, uncomplicated: Secondary | ICD-10-CM | POA: Insufficient documentation

## 2012-01-08 DIAGNOSIS — Z79899 Other long term (current) drug therapy: Secondary | ICD-10-CM | POA: Insufficient documentation

## 2012-01-08 DIAGNOSIS — K089 Disorder of teeth and supporting structures, unspecified: Secondary | ICD-10-CM | POA: Insufficient documentation

## 2012-01-08 DIAGNOSIS — G8929 Other chronic pain: Secondary | ICD-10-CM

## 2012-01-08 DIAGNOSIS — K029 Dental caries, unspecified: Secondary | ICD-10-CM | POA: Insufficient documentation

## 2012-01-08 DIAGNOSIS — I10 Essential (primary) hypertension: Secondary | ICD-10-CM | POA: Insufficient documentation

## 2012-01-08 MED ORDER — OXYCODONE-ACETAMINOPHEN 5-325 MG PO TABS: 1.0000 | ORAL_TABLET | Freq: Four times a day (QID) | ORAL | Status: DC | PRN

## 2012-01-08 MED ORDER — PENICILLIN V POTASSIUM 500 MG PO TABS: 500.0000 mg | ORAL_TABLET | Freq: Four times a day (QID) | ORAL | Status: AC

## 2012-01-08 MED ORDER — OXYCODONE-ACETAMINOPHEN 5-325 MG PO TABS
2.0000 | ORAL_TABLET | Freq: Once | ORAL | Status: AC
  Administered 2012-01-08: 2 via ORAL
  Filled 2012-01-08: qty 2

## 2012-01-08 NOTE — ED Provider Notes (Signed)
Medical screening examination/treatment/procedure(s) were performed by non-physician practitioner and as supervising physician I was immediately available for consultation/collaboration.   Boykin Baetz, MD 01/08/12 1547 

## 2012-01-08 NOTE — ED Notes (Signed)
Pt states he has dental pain in upper R gum/tooth at a tooth known to need pulled. Pain is throbbing and began last night, but was exponentially worse this am upon eating breakfast. Now pain radiates over entire R side of face, temple and jaw.

## 2012-01-08 NOTE — ED Provider Notes (Signed)
History     CSN: [id number]  Arrival date & time 01/08/12  1478   First MD Initiated Contact with Patient 01/08/12 0944      No chief complaint on file.   (Consider location/radiation/quality/duration/timing/severity/associated sxs/prior treatment) HPI Comments: Patient presents with dental pain for one week. Patient states that this is a chronic problem as he has had multiple dental carries. Of note, patient was seen here on 11/13/11 for the same complaint but never followed up with dentist. Patient states that the pain began last night and was exacerbated by eating this morning. States that pain is in his right upper jaw, 10/10, without radiation to the right temple and jaw. Denies fever or chills. Denies tongue swelling or SOB.   The history is provided by the patient. No language interpreter was used.    Past Medical History  Diagnosis Date  . Hypertension     No past surgical history on file.  No family history on file.  History  Substance Use Topics  . Smoking status: Current Every Day Smoker -- 1.0 packs/day  . Smokeless tobacco: Never Used  . Alcohol Use: No      Review of Systems  HENT: Positive for dental problem. Negative for trouble swallowing.   Respiratory: Negative for shortness of breath.     Allergies  Sulfa antibiotics  Home Medications   Current Outpatient Rx  Name Route Sig Dispense Refill  . AMLODIPINE BESYLATE 10 MG PO TABS Oral Take 10 mg by mouth daily.      There were no vitals taken for this visit.  Physical Exam  Nursing note and vitals reviewed. Constitutional: He appears well-developed and well-nourished.  HENT:  Head: Normocephalic and atraumatic.  Mouth/Throat: Oropharynx is clear and moist. Abnormal dentition. Dental caries present. No dental abscesses or uvula swelling.    Eyes: Conjunctivae normal and EOM are normal. No scleral icterus.  Neck: Normal range of motion. Neck supple.  Cardiovascular: Normal rate, regular  rhythm and normal heart sounds.   Pulmonary/Chest: Effort normal and breath sounds normal.  Abdominal: Soft. There is no tenderness.  Lymphadenopathy:    He has no cervical adenopathy.  Neurological: He is alert.  Skin: Skin is warm.    ED Course  Procedures (including critical care time)  Labs Reviewed - No data to display No results found.   1. Chronic dental pain       MDM  Patient presented with complaint of dental pain. No signs of fluctuance or abscess. Patient given pain medication with improvement. Patient discharged on same in addition to penicillin and referral to dentist on call. Return precautions given. Patient has no red flags for Ludwig's angina.        Pixie Casino, PA-C 01/08/12 1009

## 2013-06-24 ENCOUNTER — Emergency Department (HOSPITAL_COMMUNITY)
Admission: EM | Admit: 2013-06-24 | Discharge: 2013-06-24 | Disposition: A | Discharge status: Home / Self Care | Payer: Medicaid Other | Attending: Emergency Medicine | Admitting: Emergency Medicine

## 2013-06-24 ENCOUNTER — Encounter (HOSPITAL_COMMUNITY): Payer: Self-pay | Admitting: Emergency Medicine

## 2013-06-24 DIAGNOSIS — M25569 Pain in unspecified knee: Secondary | ICD-10-CM | POA: Insufficient documentation

## 2013-06-24 DIAGNOSIS — I10 Essential (primary) hypertension: Secondary | ICD-10-CM | POA: Insufficient documentation

## 2013-06-24 DIAGNOSIS — H5710 Ocular pain, unspecified eye: Secondary | ICD-10-CM | POA: Insufficient documentation

## 2013-06-24 DIAGNOSIS — M25562 Pain in left knee: Secondary | ICD-10-CM

## 2013-06-24 DIAGNOSIS — Z79899 Other long term (current) drug therapy: Secondary | ICD-10-CM | POA: Insufficient documentation

## 2013-06-24 DIAGNOSIS — H5789 Other specified disorders of eye and adnexa: Secondary | ICD-10-CM

## 2013-06-24 DIAGNOSIS — M25561 Pain in right knee: Secondary | ICD-10-CM

## 2013-06-24 DIAGNOSIS — F17200 Nicotine dependence, unspecified, uncomplicated: Secondary | ICD-10-CM | POA: Insufficient documentation

## 2013-06-24 DIAGNOSIS — G8929 Other chronic pain: Secondary | ICD-10-CM | POA: Insufficient documentation

## 2013-06-24 MED ORDER — TETRACAINE HCL 0.5 % OP SOLN
1.0000 [drp] | Freq: Once | OPHTHALMIC | Status: AC
  Administered 2013-06-24: 1 [drp] via OPHTHALMIC
  Filled 2013-06-24: qty 2

## 2013-06-24 MED ORDER — TRAMADOL HCL 50 MG PO TABS: 50.0000 mg | ORAL_TABLET | Freq: Four times a day (QID) | ORAL | Status: DC | PRN

## 2013-06-24 MED ORDER — FLUORESCEIN SODIUM 1 MG OP STRP
1.0000 | ORAL_STRIP | Freq: Once | OPHTHALMIC | Status: AC
  Administered 2013-06-24: 16:00:00 via OPHTHALMIC
  Filled 2013-06-24: qty 1

## 2013-06-24 MED ORDER — KETOROLAC TROMETHAMINE 0.5 % OP SOLN: 1.0000 [drp] | Freq: Four times a day (QID) | OPHTHALMIC | Status: DC | PRN

## 2013-06-24 NOTE — ED Provider Notes (Signed)
CSN: [id number]     Arrival date & time 06/24/13  1316 History  This chart was scribed for non-physician practitioner, Quincy Carnes, PA-C, working with Jasper Riling. Alvino Chapel, MD, by Sydell Axon, ED Scribe. This patient was seen in room [facility] and the patient's care was started at 4:36 PM.  The history is provided by the patient. No language interpreter was used.   HPI Comments: [name] is a 58 y.o. male who presents to the Emergency Department with a chief complaint of L eye pain with onset today. Patient states that he feels a FB in the eye which initially occurred when he was sitting in the house earlier today. Patient states he does not recall a particular FB entering his eye.  No chemical exposure.  No trauma to eye. Patient states that irritation has been constant and non-changing, made worse with ocular motions.  Denies visual disturbance.  Pt does not wear glasses or contact lenses.  Additionally, patient presents with chronic knee pain. He states the pain is made worse with walking and climbing stairs and that has been progressively worsening over the past week. He reports that he has been recommended a knee replacement surgery; however, has been avoiding it. Patient is requesting any alternatives to surgery, such as physical therapy. Patient states he has not been seeing a specialist for his knees and has requested information.  No intervention PTA.  Past Medical History  Diagnosis Date  . Hypertension    Past Surgical History  Procedure Laterality Date  . Hernia repair     Family History  Problem Relation Age of Onset  . Cancer Mother   . Hypertension Mother   . Diabetes Mother   . Cancer Father    History  Substance Use Topics  . Smoking status: Current Every Day Smoker -- 1.00 packs/day for 25 years  . Smokeless tobacco: Never Used  . Alcohol Use: No    Review of Systems  Constitutional: Negative for fever and chills.  Eyes: Positive for pain.  Respiratory: Negative  for shortness of breath.   Gastrointestinal: Negative for nausea and vomiting.  Musculoskeletal: Positive for arthralgias.  Neurological: Negative for dizziness, weakness, light-headedness, numbness and headaches.  All other systems reviewed and are negative.  Allergies  Sulfa antibiotics  Home Medications   Current Outpatient Rx  Name  Route  Sig  Dispense  Refill  . amLODipine (NORVASC) 10 MG tablet   Oral   Take 10 mg by mouth daily.         Marland Kitchen oxyCODONE-acetaminophen (PERCOCET) 5-325 MG per tablet   Oral   Take 1 tablet by mouth every 6 (six) hours as needed for pain.   15 tablet   0    Triage Vitals: BP 127/77  Pulse 60  Temp(Src) 98.3 F (36.8 C) (Oral)  Resp 16  SpO2 96%  Physical Exam  Nursing note and vitals reviewed. Constitutional: He is oriented to person, place, and time. He appears well-developed and well-nourished.  HENT:  Head: Normocephalic and atraumatic.  Mouth/Throat: Oropharynx is clear and moist.  Eyes: Conjunctivae, EOM and lids are normal. Pupils are equal, round, and reactive to light. No foreign body present in the left eye. Left conjunctiva is not injected.  Slit lamp exam:      The left eye shows no corneal abrasion, no corneal ulcer, no foreign body and no fluorescein uptake.  Conjunctiva non-injected; No foreign body identified in the left eye; no corneal abrasion or ulcer; negative fluorescein uptake  Neck: Normal range of motion.  Cardiovascular: Normal rate, regular rhythm and normal heart sounds.   Pulmonary/Chest: Effort normal and breath sounds normal. No respiratory distress. He has no wheezes.  Musculoskeletal: Normal range of motion.  Bilateral knees with crepitus on flexion and extension. No gross deformities.  Neurological: He is alert and oriented to person, place, and time.  Skin: Skin is warm and dry.  Psychiatric: He has a normal mood and affect.    ED Course  Procedures (including critical care time)  DIAGNOSTIC  STUDIES: Oxygen Saturation is 96% on room air, adequate by my interpretation.    COORDINATION OF CARE: 4:40 PM-Discussed negative fundoscopic eye exam, and likelihood of irritation. Discussed the possibility of having steroid shots and other methods to manage the knee pain. Will recommend orthopedic physicians in [facility] for patient. Treatment plan discussed with patient and patient agrees.  MDM   Final diagnoses:  Eye irritation  Bilateral knee pain   Visual acuity 20/20.  No foreign body identified in left eye. No corneal abrasion or ulcer, negative fluorescein uptake.  Patient prescribed Acular for comfort. Chronic knee pain secondary to osteoarthritis. No new injuries or trauma. Patient will be referred to orthopedics for further management.  Rx tramadol. Discussed plan with pt, they agreed.  Return precautions advised.  I personally performed the services described in this documentation, which was scribed in my presence. The recorded information has been reviewed and is accurate.   Larene Pickett, PA-C 06/24/13 [phone]

## 2013-06-24 NOTE — ED Provider Notes (Signed)
Medical screening examination/treatment/procedure(s) were performed by non-physician practitioner and as supervising physician I was immediately available for consultation/collaboration.   EKG Interpretation None       [name]. Alvino Chapel, MD 06/24/13 [phone]

## 2013-06-24 NOTE — Discharge Instructions (Signed)
Take the prescribed medication as directed.  May take tramadol with tylenol for maximum relief. Follow-up with Dr. Mardelle Matte for further evaluation of you knee pain/possible surgery. Return to the ED for new or worsening symptoms.

## 2013-06-24 NOTE — ED Notes (Signed)
Pt reports left eye pain started today and says "it feels like something is in my eye". Also c/o chronic knee pain and is "putting off surgery".

## 2013-08-19 ENCOUNTER — Emergency Department (HOSPITAL_COMMUNITY)
Admission: EM | Admit: 2013-08-19 | Discharge: 2013-08-20 | Disposition: A | Discharge status: Home / Self Care | Payer: Medicaid Other | Attending: Emergency Medicine | Admitting: Emergency Medicine

## 2013-08-19 ENCOUNTER — Encounter (HOSPITAL_COMMUNITY): Payer: Self-pay | Admitting: Emergency Medicine

## 2013-08-19 ENCOUNTER — Emergency Department (HOSPITAL_COMMUNITY): Payer: Medicaid Other

## 2013-08-19 DIAGNOSIS — R0602 Shortness of breath: Secondary | ICD-10-CM | POA: Diagnosis present

## 2013-08-19 DIAGNOSIS — I10 Essential (primary) hypertension: Secondary | ICD-10-CM | POA: Insufficient documentation

## 2013-08-19 DIAGNOSIS — E876 Hypokalemia: Secondary | ICD-10-CM | POA: Diagnosis not present

## 2013-08-19 DIAGNOSIS — F17200 Nicotine dependence, unspecified, uncomplicated: Secondary | ICD-10-CM | POA: Diagnosis not present

## 2013-08-19 DIAGNOSIS — R079 Chest pain, unspecified: Secondary | ICD-10-CM | POA: Diagnosis not present

## 2013-08-19 DIAGNOSIS — Z79899 Other long term (current) drug therapy: Secondary | ICD-10-CM | POA: Diagnosis not present

## 2013-08-19 DIAGNOSIS — M542 Cervicalgia: Secondary | ICD-10-CM | POA: Insufficient documentation

## 2013-08-19 LAB — I-STAT TROPONIN, ED: TROPONIN I, POC: 0.01 ng/mL (ref 0.00–0.08)

## 2013-08-19 LAB — BASIC METABOLIC PANEL
BUN: 13 mg/dL (ref 6–23)
CO2: 29 meq/L (ref 19–32)
Calcium: 9.1 mg/dL (ref 8.4–10.5)
Chloride: 96 mEq/L (ref 96–112)
Creatinine, Ser: 0.97 mg/dL (ref 0.50–1.35)
GFR calc Af Amer: >90 mL/min (ref >90)
GFR calc non Af Amer: >90 mL/min (ref >90)
GLUCOSE: 134 mg/dL — AB (ref 70–99)
POTASSIUM: 2.8 meq/L — AB (ref 3.7–5.3)
SODIUM: 140 meq/L (ref 137–147)

## 2013-08-19 LAB — CBC
HCT: 32.9 % — ABNORMAL LOW (ref 39.0–52.0)
HEMOGLOBIN: 11.2 g/dL — AB (ref 13.0–17.0)
MCH: 28.8 pg (ref 26.0–34.0)
MCHC: 34 g/dL (ref 30.0–36.0)
MCV: 84.6 fL (ref 78.0–100.0)
Platelets: 238 10*3/uL (ref 150–400)
RBC: 3.89 MIL/uL — AB (ref 4.22–5.81)
RDW: 14.2 % (ref 11.5–15.5)
WBC: 8.5 10*3/uL (ref 4.0–10.5)

## 2013-08-19 LAB — PRO B NATRIURETIC PEPTIDE: Pro B Natriuretic peptide (BNP): 139.4 pg/mL — ABNORMAL HIGH (ref 0–125)

## 2013-08-19 MED ORDER — POTASSIUM CHLORIDE CRYS ER 20 MEQ PO TBCR
40.0000 meq | EXTENDED_RELEASE_TABLET | Freq: Once | ORAL | Status: AC
  Administered 2013-08-19: 40 meq via ORAL
  Filled 2013-08-19: qty 2

## 2013-08-19 MED ORDER — POTASSIUM CHLORIDE 10 MEQ/100ML IV SOLN
10.0000 meq | INTRAVENOUS | Status: AC
  Administered 2013-08-19 – 2013-08-20 (×3): 10 meq via INTRAVENOUS
  Filled 2013-08-19 (×3): qty 100

## 2013-08-19 NOTE — ED Notes (Signed)
Patient with shortness of breath that started earlier today.  Patient states he is having some chest pain with the shortness of breath.  Patient is unable to take a deep breath per patient.  Patient is speaking in complete sentences.

## 2013-08-19 NOTE — ED Provider Notes (Signed)
CSN: [id number]     Arrival date & time 08/19/13  2053 History   First MD Initiated Contact with Patient 08/19/13 2259     Chief Complaint  Patient presents with  . Shortness of Breath  . Chest Pain     (Consider location/radiation/quality/duration/timing/severity/associated sxs/prior Treatment) HPI 58 year old male presents to emergency room from home with complaint of cramping pain in the center of his chest as well as the back of his neck.  Symptoms started tonight.  Patient reports generalized weakness.  He reports he gets tightness in his chest when he takes a deep breath due to the cramping.  Patient reports symptoms are worse when lying flat.  Patient has history of hypertension for which she takes Norvasc and hydrochlorothiazide.  He smokes one pack a day.  He denies any leg cramping.  No sharp or pleuritic type pain.  No prior history of coronary disease. Past Medical History  Diagnosis Date  . Hypertension    Past Surgical History  Procedure Laterality Date  . Hernia repair     Family History  Problem Relation Age of Onset  . Cancer Mother   . Hypertension Mother   . Diabetes Mother   . Cancer Father    History  Substance Use Topics  . Smoking status: Current Every Day Smoker -- 1.00 packs/day for 25 years  . Smokeless tobacco: Never Used  . Alcohol Use: No    Review of Systems  See History of Present Illness; otherwise all other systems are reviewed and negative   Allergies  Sulfa antibiotics  Home Medications   Prior to Admission medications   Medication Sig Start Date End Date Taking? Authorizing Provider  amLODipine (NORVASC) 10 MG tablet Take 10 mg by mouth daily.   Yes Historical Provider, MD   BP 113/72  Pulse 52  Temp(Src) 98.8 F (37.1 C) (Oral)  Resp 13  Wt 220 lb 8 oz (100.018 kg)  SpO2 96% Physical Exam  Nursing note and vitals reviewed. Constitutional: He is oriented to person, place, and time. He appears well-developed and  well-nourished.  HENT:  Head: Normocephalic and atraumatic.  Nose: Nose normal.  Mouth/Throat: Oropharynx is clear and moist.  Eyes: Conjunctivae and EOM are normal. Pupils are equal, round, and reactive to light.  Neck: Normal range of motion. Neck supple. No JVD present. No tracheal deviation present. No thyromegaly present.  Cardiovascular: Normal rate, regular rhythm, normal heart sounds and intact distal pulses.  Exam reveals no gallop and no friction rub.   No murmur heard. Pulmonary/Chest: Effort normal and breath sounds normal. No stridor. No respiratory distress. He has no wheezes. He has no rales. He exhibits no tenderness.  Abdominal: Soft. Bowel sounds are normal. He exhibits no distension and no mass. There is no tenderness. There is no rebound and no guarding.  Musculoskeletal: Normal range of motion. He exhibits no edema and no tenderness.  Lymphadenopathy:    He has no cervical adenopathy.  Neurological: He is alert and oriented to person, place, and time. He has normal reflexes. No cranial nerve deficit. He exhibits normal muscle tone. Coordination normal.  Skin: Skin is warm and dry. No rash noted. No erythema. No pallor.  Psychiatric: He has a normal mood and affect. His behavior is normal. Judgment and thought content normal.    ED Course  Procedures (including critical care time) Labs Review Labs Reviewed  CBC - Abnormal; Notable for the following:    RBC 3.89 (*)    Hemoglobin  11.2 (*)    HCT 32.9 (*)    All other components within normal limits  BASIC METABOLIC PANEL - Abnormal; Notable for the following:    Potassium 2.8 (*)    Glucose, Bld 134 (*)    All other components within normal limits  PRO B NATRIURETIC PEPTIDE - Abnormal; Notable for the following:    Pro B Natriuretic peptide (BNP) 139.4 (*)    All other components within normal limits  TROPONIN I  Randolm Idol, ED    Imaging Review Dg Chest 2 View  08/19/2013   CLINICAL DATA:  Chest pain.   Hypertension.  EXAM: CHEST  2 VIEW  COMPARISON:  08/09/2010.  FINDINGS: Bibasilar atelectasis persists in may be slightly worsened priors, strictly on the right. Mildly enlarged cardiomediastinal silhouette. Calcified tortuous aorta. Old left rib fracture #6. No definite active infiltrates or failure.  IMPRESSION: No active cardiopulmonary disease. Slight increase bibasilar atelectasis. Cardiomegaly.   Electronically Signed   By: Rolla Flatten M.D.   On: 08/19/2013 22:09     EKG Interpretation   Date/Time:  Monday August 19 2013 21:11:35 EDT Ventricular Rate:  52 PR Interval:  200 QRS Duration: 116 QT Interval:  564 QTC Calculation: 524 R Axis:   52 Text Interpretation:  Sinus bradycardia T wave abnormality, consider  lateral ischemia Prolonged QT Abnormal ECG Poor data quality No  significant change since last tracing Confirmed by Kmarion Rawl  MD, Romuald Mccaslin ([id number])  on 08/19/2013 11:04:05 PM       EKG Interpretation  Date/Time:  Monday August 19 2013 21:12:09 EDT Ventricular Rate:  52 PR Interval:  210 QRS Duration: 118 QT Interval:  548 QTC Calculation: 509 R Axis:   53 Text Interpretation:  Sinus bradycardia with 1st degree A-V block Non-specific intra-ventricular conduction delay T wave abnormality, consider anterolateral ischemia Prolonged QT Abnormal ECG improved quality and baseline from prior.   Confirmed by Sharol Given  MD, Aibhlinn Kalmar ([id number]) on 08/19/2013 11:05:00 PM        EKG Interpretation  Date/Time:  Monday August 19 2013 22:14:24 EDT Ventricular Rate:  52 PR Interval:  203 QRS Duration: 127 QT Interval:  682 QTC Calculation: 634 R Axis:   69 Text Interpretation:  Sinus rhythm Borderline prolonged PR interval Nonspecific intraventricular conduction delay Abnrm T, consider ischemia, anterolateral lds No significant change since last tracing Confirmed by Fleeta Kunde  MD, Hephzibah Strehle ([id number]) on 08/19/2013 11:06:07 PM         MDM   Final diagnoses:  Hypokalemia    58 year old male with crampy type  pain in chest and neck tonight noted to be hypokalemic.  Tilt are negative, sinus bradycardia with first degree block AP of his hypokalemia.  Patient has a flipped T waves laterally, but this is a chronic finding.  Patient feeling much better after repletion of potassium, Robaxin.  Patient to followup with primary care Dr.  Truman Hayward  For recheck this week    Kalman Drape, MD 08/20/13 [phone]

## 2013-08-19 NOTE — ED Notes (Signed)
Critical K+ called from lab 

## 2013-08-19 NOTE — ED Notes (Addendum)
Pt reports SOB since today, worse when laying flat and with exertion. Lungs clear. Pt in NAD. Pt denies hx of low potassium. Pt denies any CP. Pt has +1 edema to bilateral legs. Unsure if that is new, normal or worse.

## 2013-08-19 NOTE — ED Notes (Signed)
Lab called to inquire about BNP.  

## 2013-08-20 ENCOUNTER — Encounter (HOSPITAL_COMMUNITY): Payer: Self-pay | Admitting: Emergency Medicine

## 2013-08-20 ENCOUNTER — Emergency Department (HOSPITAL_COMMUNITY)
Admission: EM | Admit: 2013-08-20 | Discharge: 2013-08-20 | Disposition: A | Discharge status: Home / Self Care | Payer: Medicaid Other | Source: Home / Self Care | Attending: Emergency Medicine | Admitting: Emergency Medicine

## 2013-08-20 ENCOUNTER — Emergency Department (HOSPITAL_COMMUNITY): Payer: Medicaid Other

## 2013-08-20 DIAGNOSIS — I10 Essential (primary) hypertension: Secondary | ICD-10-CM

## 2013-08-20 DIAGNOSIS — R079 Chest pain, unspecified: Secondary | ICD-10-CM

## 2013-08-20 DIAGNOSIS — Z79899 Other long term (current) drug therapy: Secondary | ICD-10-CM

## 2013-08-20 DIAGNOSIS — F17200 Nicotine dependence, unspecified, uncomplicated: Secondary | ICD-10-CM | POA: Insufficient documentation

## 2013-08-20 DIAGNOSIS — R609 Edema, unspecified: Secondary | ICD-10-CM

## 2013-08-20 LAB — TROPONIN I
Troponin I: <0.3 ng/mL (ref <0.30)
Troponin I: <0.3 ng/mL (ref <0.30)

## 2013-08-20 LAB — CBC WITH DIFFERENTIAL/PLATELET
Basophils Absolute: 0 10*3/uL (ref 0.0–0.1)
Basophils Relative: 0 % (ref 0–1)
Eosinophils Absolute: 0.2 10*3/uL (ref 0.0–0.7)
Eosinophils Relative: 2 % (ref 0–5)
HCT: 33 % — ABNORMAL LOW (ref 39.0–52.0)
Hemoglobin: 11.2 g/dL — ABNORMAL LOW (ref 13.0–17.0)
Lymphocytes Relative: 26 % (ref 12–46)
Lymphs Abs: 2.5 10*3/uL (ref 0.7–4.0)
MCH: 28.7 pg (ref 26.0–34.0)
MCHC: 33.9 g/dL (ref 30.0–36.0)
MCV: 84.6 fL (ref 78.0–100.0)
Monocytes Absolute: 1.4 10*3/uL — ABNORMAL HIGH (ref 0.1–1.0)
Monocytes Relative: 15 % — ABNORMAL HIGH (ref 3–12)
Neutro Abs: 5.4 10*3/uL (ref 1.7–7.7)
Neutrophils Relative %: 57 % (ref 43–77)
Platelets: 218 10*3/uL (ref 150–400)
RBC: 3.9 MIL/uL — ABNORMAL LOW (ref 4.22–5.81)
RDW: 14.1 % (ref 11.5–15.5)
WBC: 9.4 10*3/uL (ref 4.0–10.5)

## 2013-08-20 LAB — BASIC METABOLIC PANEL
BUN: 13 mg/dL (ref 6–23)
CO2: 27 mEq/L (ref 19–32)
Calcium: 8.7 mg/dL (ref 8.4–10.5)
Chloride: 98 mEq/L (ref 96–112)
Creatinine, Ser: 0.78 mg/dL (ref 0.50–1.35)
GFR calc Af Amer: >90 mL/min (ref >90)
GFR calc non Af Amer: >90 mL/min (ref >90)
Glucose, Bld: 96 mg/dL (ref 70–99)
Potassium: 3.3 mEq/L — ABNORMAL LOW (ref 3.7–5.3)
Sodium: 136 mEq/L — ABNORMAL LOW (ref 137–147)

## 2013-08-20 MED ORDER — POTASSIUM CHLORIDE CRYS ER 20 MEQ PO TBCR
40.0000 meq | EXTENDED_RELEASE_TABLET | Freq: Once | ORAL | Status: AC
  Administered 2013-08-20: 40 meq via ORAL
  Filled 2013-08-20: qty 2

## 2013-08-20 MED ORDER — NAPROXEN 250 MG PO TABS: 250.0000 mg | ORAL_TABLET | Freq: Three times a day (TID) | ORAL | Status: DC

## 2013-08-20 MED ORDER — IOHEXOL 350 MG/ML SOLN
100.0000 mL | Freq: Once | INTRAVENOUS | Status: AC | PRN
  Administered 2013-08-20: 100 mL via INTRAVENOUS

## 2013-08-20 MED ORDER — MORPHINE SULFATE 4 MG/ML IJ SOLN
6.0000 mg | Freq: Once | INTRAMUSCULAR | Status: AC
  Administered 2013-08-20: 6 mg via INTRAVENOUS
  Filled 2013-08-20: qty 2

## 2013-08-20 MED ORDER — ONDANSETRON HCL 4 MG/2ML IJ SOLN
4.0000 mg | Freq: Once | INTRAMUSCULAR | Status: AC
  Administered 2013-08-20: 4 mg via INTRAVENOUS
  Filled 2013-08-20: qty 2

## 2013-08-20 MED ORDER — KETOROLAC TROMETHAMINE 15 MG/ML IJ SOLN
15.0000 mg | Freq: Once | INTRAMUSCULAR | Status: AC
  Administered 2013-08-20: 15 mg via INTRAVENOUS
  Filled 2013-08-20: qty 1

## 2013-08-20 MED ORDER — KETOROLAC TROMETHAMINE 30 MG/ML IJ SOLN
30.0000 mg | Freq: Once | INTRAMUSCULAR | Status: AC
  Administered 2013-08-20: 30 mg via INTRAVENOUS
  Filled 2013-08-20: qty 1

## 2013-08-20 MED ORDER — POTASSIUM CHLORIDE ER 20 MEQ PO TBCR: 10.0000 meq | EXTENDED_RELEASE_TABLET | Freq: Every day | ORAL | Status: DC

## 2013-08-20 MED ORDER — METHOCARBAMOL 750 MG PO TABS: 750.0000 mg | ORAL_TABLET | Freq: Four times a day (QID) | ORAL | Status: DC | PRN

## 2013-08-20 MED ORDER — METHOCARBAMOL 500 MG PO TABS
1000.0000 mg | ORAL_TABLET | Freq: Once | ORAL | Status: AC
  Administered 2013-08-20: 1000 mg via ORAL
  Filled 2013-08-20: qty 2

## 2013-08-20 MED ORDER — SODIUM CHLORIDE 0.9 % IV SOLN: INTRAVENOUS | Status: DC

## 2013-08-20 NOTE — ED Notes (Signed)
Pt c/o chest pain nd SOB, was seen at [facility] yesterday for same symptoms told K+ was low. Pt states symtoms have not gotten better.

## 2013-08-20 NOTE — Discharge Instructions (Signed)
Chest Pain (Nonspecific) °Chest pain has many causes. Your pain could be caused by something serious, such as a heart attack or a blood clot in the lungs. It could also be caused by something less serious, such as a chest bruise or a virus. Follow up with your doctor. More lab tests or other studies may be needed to find the cause of your pain. Most of the time, nonspecific chest pain will improve within 2 to 3 days of rest and mild pain medicine. °HOME CARE °· For chest bruises, you may put ice on the sore area for 15-20 minutes, 03-04 times a day. Do this only if it makes you feel better. °· Put ice in a plastic bag. °· Place a towel between the skin and the bag. °· Rest for the next 2 to 3 days. °· Go back to work if the pain improves. °· See your doctor if the pain lasts longer than 1 to 2 weeks. °· Only take medicine as told by your doctor. °· Quit smoking if you smoke. °GET HELP RIGHT AWAY IF:  °· There is more pain or pain that spreads to the arm, neck, jaw, back, or belly (abdomen). °· You have shortness of breath. °· You cough more than usual or cough up blood. °· You have very bad back or belly pain, feel sick to your stomach (nauseous), or throw up (vomit). °· You have very bad weakness. °· You pass out (faint). °· You have a fever. °Any of these problems may be serious and may be an emergency. Do not wait to see if the problems will go away. Get medical help right away. Call your local emergency services 911 in [place] Do not drive yourself to the hospital. °MAKE SURE YOU:  °· Understand these instructions. °· Will watch this condition. °· Will get help right away if you or your child is not doing well or gets worse. °Document Released: 08/17/2007 Document Revised: 05/23/2011 Document Reviewed: 08/17/2007 °[employer]® Patient Information ©2014 [employer]. ° °

## 2013-08-20 NOTE — ED Notes (Signed)
Patient transported to CT 

## 2013-08-20 NOTE — ED Provider Notes (Signed)
CSN: [id number]     Arrival date & time 08/20/13  1454 History   First MD Initiated Contact with Patient 08/20/13 1504     Chief Complaint  Patient presents with  . Chest Pain  . Shortness of Breath     (Consider location/radiation/quality/duration/timing/severity/associated sxs/prior Treatment) HPI  58 year old male with chest pain. Onset yesterday. Seen in [facility] ED last night for the same. Symptoms have not appreciably changed. Constant . He describes a pressure like sensation in the middle of his chest. "Feels like there is gas trapped there." Pressure in his neck and head as well. Symptoms are worse with deep inspiration and worse when laying flat. No cough. Feels like cannot get a deep breath. No fevers or chills. No unusual leg pain or swelling.   Past Medical History  Diagnosis Date  . Hypertension    Past Surgical History  Procedure Laterality Date  . Hernia repair     Family History  Problem Relation Age of Onset  . Cancer Mother   . Hypertension Mother   . Diabetes Mother   . Cancer Father    History  Substance Use Topics  . Smoking status: Current Every Day Smoker -- 1.00 packs/day for 25 years  . Smokeless tobacco: Never Used  . Alcohol Use: No    Review of Systems  All systems reviewed and negative, other than as noted in HPI.   Allergies  Sulfa antibiotics  Home Medications   Prior to Admission medications   Medication Sig Start Date End Date Taking? Authorizing Provider  amLODipine (NORVASC) 10 MG tablet Take 10 mg by mouth daily.   Yes Historical Provider, MD  methocarbamol (ROBAXIN-750) 750 MG tablet Take 1 tablet (750 mg total) by mouth every 6 (six) hours as needed for muscle spasms. 08/20/13  Yes Kalman Drape, MD  potassium chloride 20 MEQ TBCR Take 10 mEq by mouth daily. 08/20/13  Yes Kalman Drape, MD   BP 124/79  Pulse 65  Temp(Src) 97.8 F (36.6 C) (Oral)  Resp 20  SpO2 95% Physical Exam  Nursing note and vitals reviewed. Constitutional:  He appears well-developed and well-nourished. No distress.  HENT:  Head: Normocephalic and atraumatic.  Eyes: Conjunctivae are normal. Right eye exhibits no discharge. Left eye exhibits no discharge.  Neck: Neck supple.  Cardiovascular: Normal rate, regular rhythm and normal heart sounds.  Exam reveals no gallop and no friction rub.   No murmur heard. Pulmonary/Chest: Effort normal and breath sounds normal. No respiratory distress.  Abdominal: Soft. He exhibits no distension. There is no tenderness.  Musculoskeletal: He exhibits edema. He exhibits no tenderness.  Mild symmetric pitting LE edema  Neurological: He is alert.  Skin: Skin is warm and dry.  Psychiatric: He has a normal mood and affect. His behavior is normal. Thought content normal.    ED Course  Procedures (including critical care time) Labs Review Labs Reviewed  CBC WITH DIFFERENTIAL - Abnormal; Notable for the following:    RBC 3.90 (*)    Hemoglobin 11.2 (*)    HCT 33.0 (*)    Monocytes Relative 15 (*)    Monocytes Absolute 1.4 (*)    All other components within normal limits  BASIC METABOLIC PANEL - Abnormal; Notable for the following:    Sodium 136 (*)    Potassium 3.3 (*)    All other components within normal limits  TROPONIN I    Imaging Review Dg Chest 2 View  08/19/2013   CLINICAL DATA:  Chest pain.  Hypertension.  EXAM: CHEST  2 VIEW  COMPARISON:  08/09/2010.  FINDINGS: Bibasilar atelectasis persists in may be slightly worsened priors, strictly on the right. Mildly enlarged cardiomediastinal silhouette. Calcified tortuous aorta. Old left rib fracture #6. No definite active infiltrates or failure.  IMPRESSION: No active cardiopulmonary disease. Slight increase bibasilar atelectasis. Cardiomegaly.   Electronically Signed   By: Rolla Flatten M.D.   On: 08/19/2013 22:09     EKG Interpretation   Date/Time:  Tuesday August 20 2013 15:38:18 EDT Ventricular Rate:  60 PR Interval:  193 QRS Duration: 120 QT  Interval:  564 QTC Calculation: 564 R Axis:   30 Text Interpretation:  Sinus rhythm Nonspecific intraventricular conduction  delay T wave abnormality, consider anterolateral ischemia NO SIGNIFICANT  CHANGE SINCE LAST TRACING YESTERDAY Confirmed by Wilson Singer  MD, [place] ([id number])  on 08/20/2013 3:59:01 PM      MDM   Final diagnoses:  Chest pain    58yM with CP. EKG not acutely changed from last evaluation. MI effectively ruled out with constant symptoms and negative troponins more than 12 hours apart. CT w/o evidence of PE, dissection or focal infiltrate. Coronary atherosclerosis was noted. Some hypoxemia with long smoking history. No increased WOB. Lungs clear. May potentially be pericarditis? Not clinically convincing. No pericardial effusion on CT.  Will give trial of NSAIDs. Supplement K with persistent mild hypokalemia.    Virgel Manifold, MD 08/24/13 [phone]

## 2013-08-20 NOTE — Progress Notes (Signed)
  CARE MANAGEMENT ED NOTE 08/20/2013  Patient:  [name], [name]   Account Number:  [id number]  Date Initiated:  08/20/2013  Documentation initiated by:  Jackelyn Poling  Subjective/Objective Assessment:   58 yr old medicaid [place] access covered [employer] resident pt without pcp listed  c/o chest pain nd SOB, was seen at [facility] yesterday for same symptoms told K+ was low. Pt states symptoms have not gotten better.     Subjective/Objective Assessment Detail:   Response hx of e medicaid search in patient station confirms pcp as [facility]     Action/Plan:   EPIC updated   Action/Plan Detail:   Anticipated DC Date:  08/20/2013     Status Recommendation to Physician:   Result of Recommendation:    Other ED [place]  Other  PCP issues    Choice offered to / List presented to:            Status of service:  Completed, signed off  ED Comments:   ED Comments Detail:

## 2013-08-20 NOTE — Discharge Instructions (Signed)
Your low potassium is most likely due to your use of Hydrochlorothiazide.  Take potassium supplements.  Follow up with your doctor for recheck this week.  Return to the ER for worsening condition or new concerning symptoms.   Hypokalemia Hypokalemia means that the amount of potassium in the blood is lower than normal.Potassium is a chemical, called an electrolyte, that helps regulate the amount of fluid in the body. It also stimulates muscle contraction and helps nerves function properly.Most of the body's potassium is inside of cells, and only a very small amount is in the blood. Because the amount in the blood is so small, minor changes can be life-threatening. CAUSES  Antibiotics.  Diarrhea or vomiting.  Using laxatives too much, which can cause diarrhea.  Chronic kidney disease.  Water pills (diuretics).  Eating disorders (bulimia).  Low magnesium level.  Sweating a lot. SIGNS AND SYMPTOMS  Weakness.  Constipation.  Fatigue.  Muscle cramps.  Mental confusion.  Skipped heartbeats or irregular heartbeat (palpitations).  Tingling or numbness. DIAGNOSIS  Your health care provider can diagnose hypokalemia with blood tests. In addition to checking your potassium level, your health care provider may also check other lab tests. TREATMENT Hypokalemia can be treated with potassium supplements taken by mouth or adjustments in your current medicines. If your potassium level is very low, you may need to get potassium through a vein (IV) and be monitored in the hospital. A diet high in potassium is also helpful. Foods high in potassium are:  Nuts, such as peanuts and pistachios.  Seeds, such as sunflower seeds and pumpkin seeds.  Peas, lentils, and lima beans.  Whole grain and bran cereals and breads.  Fresh fruit and vegetables, such as apricots, avocado, bananas, cantaloupe, kiwi, oranges, tomatoes, asparagus, and potatoes.  Orange and tomato juices.  Red  meats.  Fruit yogurt. HOME CARE INSTRUCTIONS  Take all medicines as prescribed by your health care provider.  Maintain a healthy diet by including nutritious food, such as fruits, vegetables, nuts, whole grains, and lean meats.  If you are taking a laxative, be sure to follow the directions on the label. SEEK MEDICAL CARE IF:  Your weakness gets worse.  You feel your heart pounding or racing.  You are vomiting or having diarrhea.  You are diabetic and having trouble keeping your blood glucose in the normal range. SEEK IMMEDIATE MEDICAL CARE IF:  You have chest pain, shortness of breath, or dizziness.  You are vomiting or having diarrhea for more than 2 days.  You faint. MAKE SURE YOU:   Understand these instructions.  Will watch your condition.  Will get help right away if you are not doing well or get worse. Document Released: 02/28/2005 Document Revised: 12/19/2012 Document Reviewed: 08/31/2012 [facility] Patient Information 2014 [place].  Potassium Content of Foods Potassium is a mineral found in many foods and drinks. It helps keep fluids and minerals balanced in your body and also affects how steadily your heart beats. The body needs potassium to control blood pressure and to keep the muscles and nervous system healthy. However, certain health conditions and medicine may require you to eat more or less potassium-rich foods and drinks. Your caregiver or dietitian will tell you how much potassium you should have each day. COMMON SERVING SIZES The list below tells you how big or small common portion sizes are:  1 oz.........4 stacked dice.  3 oz........Marland KitchenDeck of cards.  1 tsp.......Marland KitchenTip of little finger.  1 tbsp....Marland KitchenMarland KitchenThumb.  2 tbsp....Marland KitchenMarland KitchenGolf ball.  c.Marland Kitchen........Marland KitchenHalf of a fist.  1 c...........Marland KitchenA fist. FOODS AND DRINKS HIGH IN POTASSIUM More than 200 mg of potassium per serving. A serving size is  c (120 mL or noted gram weight) unless otherwise stated.  While all the items on this list are high in potassium, some items are higher in potassium than others. Fruits  Apricots (sliced), 83 g.  Apricots (dried halves), 3 oz / 24 g.  Avocado (cubed),  c / 50 g.  Banana (sliced), 75 g.  Cantaloupe (cubed), 80 g.  Dates (pitted), 5 whole / 35 g.  Figs (dried), 4 whole / 32 g.  Guava, c / 55 g.  Honeydew, 1 wedge / 85 g.  Kiwi (sliced), 90 g.  Nectarine, 1 small / 129 g.  Orange, 1 medium / 131 g.  Orange juice.  Pomegranate seeds, 87 g.  Pomegranate juice.  Prunes (pitted), 3 whole / 30 g.  Prune juice, 3 oz / 90 mL.  Seedless raisins, 3 tbsp / 27 g. Vegetables  Artichoke,  of a medium / 64 g.  Asparagus (boiled), 90 g.  Baked beans,  c / 63 g.  Bamboo shoots,  c / 38 g.  Beets (cooked slices), 85 g.  Broccoli (boiled), 78 g.  Brussels sprout (boiled), 78 g.  Butternut squash (baked), 103 g.  Chickpea (cooked), 82 g.  Green peas (cooked), 80 g.  Hubbard squash (baked cubes),  c / 68 g.  Kidney beans (cooked), 5 tbsp / 55 g.  Lima beans (cooked),  c / 43 g.  Navy beans (cooked),  c / 61 g.  Potato (baked), 61 g.  Potato (boiled), 78 g.  Pumpkin (boiled), 123 g.  Refried beans,  c / 79 g.  Spinach (cooked),  c / 45 g.  Split peas (cooked),  c / 65 g.  Sun-dried tomatoes, 2 tbsp / 7 g.  Sweet potato (baked),  c / 50 g.  Tomato (chopped or sliced), 90 g.  Tomato juice.  Tomato paste, 4 tsp / 21 g.  Tomato sauce,  c / 61 g.  Vegetable juice.  White mushrooms (cooked), 78 g.  Yam (cooked or baked),  c / 34 g.  Zucchini squash (boiled), 90 g. Other Foods and Drinks  Almonds (whole),  c / 36 g.  Cashews (oil roasted),  c / 32 g.  Chocolate milk.  Chocolate pudding, 142 g.  Clams (steamed), 1.5 oz / 43 g.  Dark chocolate, 1.5 oz / 42 g.  Fish, 3 oz / 85 g.  King crab (steamed), 3 oz / 85 g.  Lobster (steamed), 4 oz / 113 g.  Milk (skim, 1%, 2%, whole), 1 c  / 240 mL.  Milk chocolate, 2.3 oz / 66 g.  Milk shake.  Nonfat fruit variety yogurt, 123 g.  Peanuts (oil roasted), 1 oz / 28 g.  Peanut butter, 2 tbsp / 32 g.  Pistachio nuts, 1 oz / 28 g.  Pumpkin seeds, 1 oz / 28 g.  Red meat (broiled, cooked, grilled), 3 oz / 85 g.  Scallops (steamed), 3 oz / 85 g.  Shredded wheat cereal (dry), 3 oblong biscuits / 75 g.  Spaghetti sauce,  c / 66 g.  Sunflower seeds (dry roasted), 1 oz / 28 g.  Veggie burger, 1 patty / 70 g. FOODS MODERATE IN POTASSIUM Between 150 mg and 200 mg per serving. A serving is  c (120 mL or noted gram weight) unless otherwise stated. Fruits  Grapefruit,  of the fruit / 123 g.  Grapefruit juice.  Pineapple juice.  Plums (sliced), 83 g.  Tangerine, 1 large / 120 g. Vegetables  Carrots (boiled), 78 g.  Carrots (sliced), 61 g.  Rhubarb (cooked with sugar), 120 g.  Rutabaga (cooked), 120 g.  Sweet corn (cooked), 75 g.  Yellow snap beans (cooked), 63 g. Other Foods and Drinks   Bagel, 1 bagel / 98 g.  Chicken breast (roasted and chopped),  c / 70 g.  Chocolate ice cream / 66 g.  Pita bread, 1 large / 64 g.  Shrimp (steamed), 4 oz / 113 g.  Swiss cheese (diced), 70 g.  Vanilla ice cream, 66 g.  Vanilla pudding, 140 g. FOODS LOW IN POTASSIUM Less than 150 mg per serving. A serving size is  cup (120 mL or noted gram weight) unless otherwise stated. If you eat more than 1 serving of a food low in potassium, the food may be considered a food high in potassium. Fruits  Apple (slices), 55 g.  Apple juice.  Applesauce, 122 g.  Blackberries, 72 g.  Blueberries, 74 g.  Cranberries, 50 g.  Cranberry juice.  Fruit cocktail, 119 g.  Fruit punch.  Grapes, 46 g.  Grape juice.  Mandarin oranges (canned), 126 g.  Peach (slices), 77 g.  Pineapple (chunks), 83 g.  Raspberries, 62 g.  Red cherries (without pits), 78 g.  Strawberries (sliced), 83 g.  Watermelon  (diced), 76 g. Vegetables  Alfalfa sprouts, 17 g.  Signor peppers (sliced), 46 g.  Cabbage (shredded), 35 g.  Cauliflower (boiled), 62 g.  Celery, 51 g.  Collard greens (boiled), 95 g.  Cucumber (sliced), 52 g.  Eggplant (cubed), 41 g.  Green beans (boiled), 63 g.  Lettuce (shredded), 1 c / 36 g.  Onions (sauteed), 44 g.  Radishes (sliced), 58 g.  Spaghetti squash, 51 g. Other Foods and Drinks  [employer], 1 slice / 28 g.  Black tea.  Brown rice (cooked), 98 g.  Butter croissant, 1 medium / 57 g.  Carbonated soda.  Coffee.  Cheddar cheese (diced), 66 g.  Corn flake cereal (dry), 14 g.  Cottage cheese, 118 g.  Cream of rice cereal (cooked), 122 g.  Cream of wheat cereal (cooked), 126 g.  Crisped rice cereal (dry), 14 g.  Egg (boiled, fried, poached, omelet, scrambled), 1 large / 46 61 g.  English muffin, 1 muffin / 57 g.  Frozen ice pop, 1 pop / 55 g.  Graham cracker, 1 large rectangular cracker / 14 g.  Jelly beans, 112 g.  Non-dairy whipped topping.  Oatmeal, 88 g.  Orange sherbet, 74 g.  Puffed rice cereal (dry), 7 g.  Pasta (cooked), 70 g.  Rice cakes, 4 cakes / 36 g.  Sugared doughnut, 4 oz / 116 g.  White bread, 1 slice / 30 g.  White rice (cooked), 79 93 g.  Wild rice (cooked), 82 g.  Yellow cake, 1 slice / 68 g. Document Released: 10/12/2004 Document Revised: 02/15/2012 Document Reviewed: 07/15/2011 [facility] Patient Information 2014 [place].

## 2013-10-08 ENCOUNTER — Encounter (HOSPITAL_COMMUNITY): Payer: Self-pay | Admitting: Emergency Medicine

## 2013-10-08 ENCOUNTER — Emergency Department (HOSPITAL_COMMUNITY)
Admission: EM | Admit: 2013-10-08 | Discharge: 2013-10-08 | Disposition: A | Discharge status: Home / Self Care | Payer: Medicaid Other | Attending: Emergency Medicine | Admitting: Emergency Medicine

## 2013-10-08 ENCOUNTER — Emergency Department (HOSPITAL_COMMUNITY): Payer: Medicaid Other

## 2013-10-08 DIAGNOSIS — R079 Chest pain, unspecified: Secondary | ICD-10-CM | POA: Diagnosis present

## 2013-10-08 DIAGNOSIS — Z79899 Other long term (current) drug therapy: Secondary | ICD-10-CM | POA: Diagnosis not present

## 2013-10-08 DIAGNOSIS — I10 Essential (primary) hypertension: Secondary | ICD-10-CM | POA: Diagnosis not present

## 2013-10-08 DIAGNOSIS — F17200 Nicotine dependence, unspecified, uncomplicated: Secondary | ICD-10-CM | POA: Insufficient documentation

## 2013-10-08 DIAGNOSIS — Z791 Long term (current) use of non-steroidal anti-inflammatories (NSAID): Secondary | ICD-10-CM | POA: Insufficient documentation

## 2013-10-08 LAB — CBC
HEMATOCRIT: 32.6 % — AB (ref 39.0–52.0)
Hemoglobin: 10.8 g/dL — ABNORMAL LOW (ref 13.0–17.0)
MCH: 28.3 pg (ref 26.0–34.0)
MCHC: 33.1 g/dL (ref 30.0–36.0)
MCV: 85.6 fL (ref 78.0–100.0)
Platelets: 220 10*3/uL (ref 150–400)
RBC: 3.81 MIL/uL — ABNORMAL LOW (ref 4.22–5.81)
RDW: 13.9 % (ref 11.5–15.5)
WBC: 7 10*3/uL (ref 4.0–10.5)

## 2013-10-08 LAB — BASIC METABOLIC PANEL
Anion gap: 13 (ref 5–15)
BUN: 10 mg/dL (ref 6–23)
CHLORIDE: 101 meq/L (ref 96–112)
CO2: 27 meq/L (ref 19–32)
CREATININE: 0.72 mg/dL (ref 0.50–1.35)
Calcium: 9.2 mg/dL (ref 8.4–10.5)
GFR calc non Af Amer: >90 mL/min (ref >90)
GLUCOSE: 170 mg/dL — AB (ref 70–99)
Potassium: 3.2 mEq/L — ABNORMAL LOW (ref 3.7–5.3)
Sodium: 141 mEq/L (ref 137–147)

## 2013-10-08 LAB — TROPONIN I
Troponin I: <0.3 ng/mL (ref <0.30)
Troponin I: <0.3 ng/mL (ref <0.30)

## 2013-10-08 MED ORDER — HYDROCODONE-ACETAMINOPHEN 5-325 MG PO TABS
2.0000 | ORAL_TABLET | Freq: Once | ORAL | Status: AC
  Administered 2013-10-08: 2 via ORAL
  Filled 2013-10-08: qty 2

## 2013-10-08 MED ORDER — GI COCKTAIL ~~LOC~~
30.0000 mL | Freq: Once | ORAL | Status: AC
  Administered 2013-10-08: 30 mL via ORAL
  Filled 2013-10-08: qty 30

## 2013-10-08 MED ORDER — POTASSIUM CHLORIDE CRYS ER 20 MEQ PO TBCR
40.0000 meq | EXTENDED_RELEASE_TABLET | Freq: Once | ORAL | Status: AC
  Administered 2013-10-08: 40 meq via ORAL
  Filled 2013-10-08: qty 2

## 2013-10-08 MED ORDER — TRAMADOL HCL 50 MG PO TABS
50.0000 mg | ORAL_TABLET | Freq: Once | ORAL | Status: AC
  Administered 2013-10-08: 50 mg via ORAL
  Filled 2013-10-08: qty 1

## 2013-10-08 MED ORDER — FAMOTIDINE 20 MG PO TABS
20.0000 mg | ORAL_TABLET | Freq: Once | ORAL | Status: AC
  Administered 2013-10-08: 20 mg via ORAL
  Filled 2013-10-08: qty 1

## 2013-10-08 MED ORDER — PANTOPRAZOLE SODIUM 40 MG PO TBEC: 40.0000 mg | DELAYED_RELEASE_TABLET | Freq: Every day | ORAL | Status: DC

## 2013-10-08 NOTE — ED Provider Notes (Addendum)
CSN: [id number]     Arrival date & time 10/08/13  8756 History   First MD Initiated Contact with Patient 10/08/13 [phone]     Chief Complaint  Patient presents with  . Chest Pain     (Consider location/radiation/quality/duration/timing/severity/associated sxs/prior Treatment) Patient is a 58 y.o. male presenting with chest pain. The history is provided by the patient.  Chest Pain Associated symptoms: no abdominal pain, no back pain, no fever, no headache, no palpitations, no shortness of breath and not vomiting   pt c/o pain mid chest for the past 3 days. Constant. Dull. Radiates upwards from xiphoid area. Not pleuritic.  No associated sob, nv or diaphoresis. No change whether upright or supine. No relation to activity or exertion, is at rest. Constant. Denies back back. No abd pain. Occasional non prod cough, no sore throat, or other uri c/o. No fever or chills. +smoker. No hx high cholesterol. +hx htn. Denies fam hx premature cad.  No leg pain or swelling. No immobility, trauma, recent surgery or travel, or any pleuritic pain. No hx dvt or pe. ?hx gerd. States prior ED eval for same, denies prior stress test or card eval.      Past Medical History  Diagnosis Date  . Hypertension    Past Surgical History  Procedure Laterality Date  . Hernia repair     Family History  Problem Relation Age of Onset  . Cancer Mother   . Hypertension Mother   . Diabetes Mother   . Cancer Father    History  Substance Use Topics  . Smoking status: Current Every Day Smoker -- 1.00 packs/day for 25 years  . Smokeless tobacco: Never Used  . Alcohol Use: No    Review of Systems  Constitutional: Negative for fever and chills.  HENT: Negative for sore throat.   Eyes: Negative for redness.  Respiratory: Negative for shortness of breath.   Cardiovascular: Positive for chest pain. Negative for palpitations and leg swelling.  Gastrointestinal: Negative for vomiting, abdominal pain and diarrhea.   Genitourinary: Negative for flank pain.  Musculoskeletal: Negative for back pain and neck pain.  Skin: Negative for rash.  Neurological: Negative for headaches.  Hematological: Does not bruise/bleed easily.  Psychiatric/Behavioral: Negative for confusion.      Allergies  Sulfa antibiotics  Home Medications   Prior to Admission medications   Medication Sig Start Date End Date Taking? Authorizing Provider  amLODipine (NORVASC) 10 MG tablet Take 10 mg by mouth daily.    Historical Provider, MD  methocarbamol (ROBAXIN-750) 750 MG tablet Take 1 tablet (750 mg total) by mouth every 6 (six) hours as needed for muscle spasms. 08/20/13   Kalman Drape, MD  naproxen (NAPROSYN) 250 MG tablet Take 1 tablet (250 mg total) by mouth 3 (three) times daily with meals. 08/20/13   Virgel Manifold, MD  potassium chloride 20 MEQ TBCR Take 10 mEq by mouth daily. 08/20/13   Kalman Drape, MD   BP 116/76  Pulse 63  Temp(Src) 98.5 F (36.9 C) (Oral)  Resp 18  SpO2 96% Physical Exam  Nursing note and vitals reviewed. Constitutional: He is oriented to person, place, and time. He appears well-developed and well-nourished. No distress.  HENT:  Mouth/Throat: Oropharynx is clear and moist.  Eyes: Conjunctivae are normal. No scleral icterus.  Neck: Neck supple. No tracheal deviation present.  Cardiovascular: Normal rate, regular rhythm, normal heart sounds and intact distal pulses.  Exam reveals no gallop and no friction rub.   No  murmur heard. Pulmonary/Chest: Effort normal and breath sounds normal. No accessory muscle usage. No respiratory distress. He exhibits no tenderness.  Abdominal: Soft. Bowel sounds are normal. He exhibits no distension. There is no tenderness.  Musculoskeletal: Normal range of motion. He exhibits no edema and no tenderness.  Neurological: He is alert and oriented to person, place, and time.  Skin: Skin is warm and dry. No rash noted. He is not diaphoretic.  Psychiatric: He has a normal  mood and affect.    ED Course  Procedures (including critical care time) Labs Review   Results for orders placed during the hospital encounter of 10/08/13  CBC      Result Value Ref Range   WBC 7.0  4.0 - 10.5 K/uL   RBC 3.81 (*) 4.22 - 5.81 MIL/uL   Hemoglobin 10.8 (*) 13.0 - 17.0 g/dL   HCT 32.6 (*) 39.0 - 52.0 %   MCV 85.6  78.0 - 100.0 fL   MCH 28.3  26.0 - 34.0 pg   MCHC 33.1  30.0 - 36.0 g/dL   RDW 13.9  11.5 - 15.5 %   Platelets 220  150 - 400 K/uL  BASIC METABOLIC PANEL      Result Value Ref Range   Sodium 141  137 - 147 mEq/L   Potassium 3.2 (*) 3.7 - 5.3 mEq/L   Chloride 101  96 - 112 mEq/L   CO2 27  19 - 32 mEq/L   Glucose, Bld 170 (*) 70 - 99 mg/dL   BUN 10  6 - 23 mg/dL   Creatinine, Ser 0.72  0.50 - 1.35 mg/dL   Calcium 9.2  8.4 - 10.5 mg/dL   GFR calc non Af Amer >90  >90 mL/min   GFR calc Af Amer >90  >90 mL/min   Anion gap 13  5 - 15  TROPONIN I      Result Value Ref Range   Troponin I <0.30  <0.30 ng/mL   Dg Chest 2 View  10/08/2013   CLINICAL DATA:  Chest pain.  EXAM: CHEST  2 VIEW  COMPARISON:  August 19, 2013.  FINDINGS: Stable cardiomediastinal silhouette. No pneumothorax or pleural effusion is noted. Left lung is clear. Old left rib fracture is again noted. Stable density is seen in right lung base consistent with subsegmental atelectasis or scarring.  IMPRESSION: No significant change compared to prior exam. Stable mild right basilar density is noted consistent with subsegmental atelectasis or scarring.   Electronically Signed   By: Sabino Dick M.D.   On: 10/08/2013 10:28      EKG Interpretation   Date/Time:  Tuesday October 08 2013 09:20:07 EDT Ventricular Rate:  67 PR Interval:  192 QRS Duration: 126 QT Interval:  532 QTC Calculation: 562 R Axis:   65 Text Interpretation:  Sinus rhythm Nonspecific intraventricular conduction  delay Nonspecific T wave abnormality `t changes noted on prior ecgs  Confirmed by Yaw Escoto  MD, Lennette Bihari ([id number]) on  10/08/2013 9:32:21 AM      MDM  Iv ns. Labs. Cxr.  Reviewed nursing notes and prior charts for additional history.   After 3 days constant/continual symptoms, trop neg.  pepcid and gi cocktail po. Ultram po (pt has ride, does not have to drive).  Recheck pain relieved. No c/o. No sob.  kcl po.   Repeat/delta troponin also neg.  Pt w recent ct angio chest to eval same symptoms month prior also neg for acute process.   Will refer to card  f/u re atypical chest discomfort, possible outpt stress test.  Pt appears stable for d/c.       Mirna Mires, MD 10/08/13 [phone]

## 2013-10-08 NOTE — ED Notes (Signed)
Pt c/o centralized chest pain x 2 - 3 days and SOB. Denies n/v/ or dizziness.

## 2013-10-08 NOTE — Discharge Instructions (Signed)
Take protonix (acid blocker medication). You may also try pepcid and/or maalox as need for symptom relief. For chest discomfort, follow up with cardiologist in coming week - see referral - call to arrange appointment. Return to ER if worse, fevers, trouble breathing, persistent/recurrent chest pain, other concern.   You were given pain medication in the ER - no driving for the next 4 hours      Chest Pain (Nonspecific) It is often hard to give a specific diagnosis for the cause of chest pain. There is always a chance that your pain could be related to something serious, such as a heart attack or a blood clot in the lungs. You need to follow up with your health care provider for further evaluation. CAUSES   Heartburn.  Pneumonia or bronchitis.  Anxiety or stress.  Inflammation around your heart (pericarditis) or lung (pleuritis or pleurisy).  A blood clot in the lung.  A collapsed lung (pneumothorax). It can develop suddenly on its own (spontaneous pneumothorax) or from trauma to the chest.  Shingles infection (herpes zoster virus). The chest wall is composed of bones, muscles, and cartilage. Any of these can be the source of the pain.  The bones can be bruised by injury.  The muscles or cartilage can be strained by coughing or overwork.  The cartilage can be affected by inflammation and become sore (costochondritis). DIAGNOSIS  Lab tests or other studies may be needed to find the cause of your pain. Your health care provider may have you take a test called an ambulatory electrocardiogram (ECG). An ECG records your heartbeat patterns over a 24-hour period. You may also have other tests, such as:  Transthoracic echocardiogram (TTE). During echocardiography, sound waves are used to evaluate how blood flows through your heart.  Transesophageal echocardiogram (TEE).  Cardiac monitoring. This allows your health care provider to monitor your heart rate and rhythm in real  time.  Holter monitor. This is a portable device that records your heartbeat and can help diagnose heart arrhythmias. It allows your health care provider to track your heart activity for several days, if needed.  Stress tests by exercise or by giving medicine that makes the heart beat faster. TREATMENT   Treatment depends on what may be causing your chest pain. Treatment may include:  Acid blockers for heartburn.  Anti-inflammatory medicine.  Pain medicine for inflammatory conditions.  Antibiotics if an infection is present.  You may be advised to change lifestyle habits. This includes stopping smoking and avoiding alcohol, caffeine, and chocolate.  You may be advised to keep your head raised (elevated) when sleeping. This reduces the chance of acid going backward from your stomach into your esophagus. Most of the time, nonspecific chest pain will improve within 2-3 days with rest and mild pain medicine.  HOME CARE INSTRUCTIONS   If antibiotics were prescribed, take them as directed. Finish them even if you start to feel better.  For the next few days, avoid physical activities that bring on chest pain. Continue physical activities as directed.  Do not use any tobacco products, including cigarettes, chewing tobacco, or electronic cigarettes.  Avoid drinking alcohol.  Only take medicine as directed by your health care provider.  Follow your health care provider's suggestions for further testing if your chest pain does not go away.  Keep any follow-up appointments you made. If you do not go to an appointment, you could develop lasting (chronic) problems with pain. If there is any problem keeping an appointment, call to  reschedule. SEEK MEDICAL CARE IF:   Your chest pain does not go away, even after treatment.  You have a rash with blisters on your chest.  You have a fever. SEEK IMMEDIATE MEDICAL CARE IF:   You have increased chest pain or pain that spreads to your arm,  neck, jaw, back, or abdomen.  You have shortness of breath.  You have an increasing cough, or you cough up blood.  You have severe back or abdominal pain.  You feel nauseous or vomit.  You have severe weakness.  You faint.  You have chills. This is an emergency. Do not wait to see if the pain will go away. Get medical help at once. Call your local emergency services (911 in [place] Do not drive yourself to the hospital. MAKE SURE YOU:   Understand these instructions.  Will watch your condition.  Will get help right away if you are not doing well or get worse. Document Released: 12/08/2004 Document Revised: 03/05/2013 Document Reviewed: 10/04/2007 [facility] Patient Information 2015 [place], [place]. This information is not intended to replace advice given to you by your health care provider. Make sure you discuss any questions you have with your health care provider.

## 2013-10-24 DIAGNOSIS — Z79899 Other long term (current) drug therapy: Secondary | ICD-10-CM | POA: Diagnosis not present

## 2013-11-11 DIAGNOSIS — E291 Testicular hypofunction: Secondary | ICD-10-CM | POA: Diagnosis not present

## 2013-11-11 DIAGNOSIS — M159 Polyosteoarthritis, unspecified: Secondary | ICD-10-CM | POA: Diagnosis not present

## 2013-11-11 DIAGNOSIS — I10 Essential (primary) hypertension: Secondary | ICD-10-CM | POA: Diagnosis not present

## 2013-11-11 DIAGNOSIS — E785 Hyperlipidemia, unspecified: Secondary | ICD-10-CM | POA: Diagnosis not present

## 2013-11-11 DIAGNOSIS — F411 Generalized anxiety disorder: Secondary | ICD-10-CM | POA: Diagnosis not present

## 2013-11-21 ENCOUNTER — Encounter: Payer: Medicaid Other | Admitting: Cardiology

## 2013-12-03 ENCOUNTER — Encounter: Payer: Self-pay | Admitting: Cardiology

## 2014-01-16 DIAGNOSIS — Z79899 Other long term (current) drug therapy: Secondary | ICD-10-CM | POA: Diagnosis not present

## 2014-02-13 DIAGNOSIS — Z79899 Other long term (current) drug therapy: Secondary | ICD-10-CM | POA: Diagnosis not present

## 2014-07-18 DIAGNOSIS — N529 Male erectile dysfunction, unspecified: Secondary | ICD-10-CM | POA: Diagnosis not present

## 2014-07-18 DIAGNOSIS — Z720 Tobacco use: Secondary | ICD-10-CM | POA: Diagnosis not present

## 2014-07-18 DIAGNOSIS — F411 Generalized anxiety disorder: Secondary | ICD-10-CM | POA: Diagnosis not present

## 2014-07-18 DIAGNOSIS — M159 Polyosteoarthritis, unspecified: Secondary | ICD-10-CM | POA: Diagnosis not present

## 2014-07-18 DIAGNOSIS — I10 Essential (primary) hypertension: Secondary | ICD-10-CM | POA: Diagnosis not present

## 2014-07-18 DIAGNOSIS — E291 Testicular hypofunction: Secondary | ICD-10-CM | POA: Diagnosis not present

## 2014-07-18 DIAGNOSIS — E785 Hyperlipidemia, unspecified: Secondary | ICD-10-CM | POA: Diagnosis not present

## 2014-07-18 DIAGNOSIS — Z6831 Body mass index (BMI) 31.0-31.9, adult: Secondary | ICD-10-CM | POA: Diagnosis not present

## 2014-08-10 ENCOUNTER — Emergency Department (HOSPITAL_COMMUNITY)
Admission: EM | Admit: 2014-08-10 | Discharge: 2014-08-10 | Disposition: A | Discharge status: Home / Self Care | Payer: Medicare Other | Attending: Emergency Medicine | Admitting: Emergency Medicine

## 2014-08-10 ENCOUNTER — Encounter (HOSPITAL_COMMUNITY): Payer: Self-pay | Admitting: Emergency Medicine

## 2014-08-10 DIAGNOSIS — Z720 Tobacco use: Secondary | ICD-10-CM | POA: Diagnosis not present

## 2014-08-10 DIAGNOSIS — Z79899 Other long term (current) drug therapy: Secondary | ICD-10-CM | POA: Diagnosis not present

## 2014-08-10 DIAGNOSIS — K088 Other specified disorders of teeth and supporting structures: Secondary | ICD-10-CM | POA: Diagnosis not present

## 2014-08-10 DIAGNOSIS — Z7982 Long term (current) use of aspirin: Secondary | ICD-10-CM | POA: Insufficient documentation

## 2014-08-10 DIAGNOSIS — I10 Essential (primary) hypertension: Secondary | ICD-10-CM | POA: Insufficient documentation

## 2014-08-10 DIAGNOSIS — K0889 Other specified disorders of teeth and supporting structures: Secondary | ICD-10-CM

## 2014-08-10 HISTORY — DX: Bradycardia, unspecified: R00.1

## 2014-08-10 HISTORY — DX: Unspecified osteoarthritis, unspecified site: M19.90

## 2014-08-10 MED ORDER — PENICILLIN V POTASSIUM 500 MG PO TABS: 500.0000 mg | ORAL_TABLET | Freq: Four times a day (QID) | ORAL | Status: DC

## 2014-08-10 MED ORDER — HYDROCODONE-ACETAMINOPHEN 5-325 MG PO TABS: 1.0000 | ORAL_TABLET | ORAL | Status: DC | PRN

## 2014-08-10 NOTE — ED Notes (Signed)
Pt reports pain in lower gum line , in front. Pt stated that he bit into some food yesterday and a tooth feels loose

## 2014-08-10 NOTE — Discharge Instructions (Signed)
Take the prescribed medication as directed. Follow-up with your dentist-- call to make appt. Return to the ED for new or worsening symptoms.

## 2014-08-10 NOTE — ED Provider Notes (Signed)
CSN: [id number]     Arrival date & time 08/10/14  1306 History  This chart was scribed for non-physician practitioner, Quincy Carnes, PA-C working with Alfonzo Beers, MD by Evelene Croon, ED Scribe. This patient was seen in room WTR6/WTR6 and the patient's care was started at 1:28 PM.    Chief Complaint  Patient presents with  . Dental Pain    pain in lower gum line   The history is provided by the patient. No language interpreter was used.     HPI Comments:  [name] is a 59 y.o. male who presents to the Emergency Department complaining of 9/10 constant dental pain. Patient states his bottom tooth has been loose for quite some time but became more loose yesterday after biting down into a burger at a cookout.  He denies fever, facial swelling. Patient is followed by dentist, Dr. Conception Chancy.  Past Medical History  Diagnosis Date  . Hypertension    Past Surgical History  Procedure Laterality Date  . Hernia repair     Family History  Problem Relation Age of Onset  . Cancer Mother   . Hypertension Mother   . Diabetes Mother   . Cancer Father    History  Substance Use Topics  . Smoking status: Current Every Day Smoker -- 1.00 packs/day for 25 years  . Smokeless tobacco: Never Used  . Alcohol Use: No    Review of Systems  Constitutional: Negative for fever.  HENT: Positive for dental problem.   All other systems reviewed and are negative.     Allergies  Sulfa antibiotics  Home Medications   Prior to Admission medications   Medication Sig Start Date End Date Taking? Authorizing Provider  amLODipine (NORVASC) 10 MG tablet Take 10 mg by mouth every morning.     Historical Provider, MD  aspirin EC 81 MG tablet Take 81 mg by mouth every morning.    Historical Provider, MD  atorvastatin (LIPITOR) 10 MG tablet Take 10 mg by mouth every morning.    Historical Provider, MD  pantoprazole (PROTONIX) 40 MG tablet Take 1 tablet (40 mg total) by mouth daily. 10/08/13   Lajean Saver,  MD  potassium chloride SA (K-DUR,KLOR-CON) 20 MEQ tablet Take 10 mEq by mouth every morning.    Historical Provider, MD  vitamin B-12 (CYANOCOBALAMIN) 100 MCG tablet Take 100 mcg by mouth every morning.    Historical Provider, MD   BP 146/91 mmHg  Pulse 46  Temp(Src) 98.1 F (36.7 C) (Oral)  Resp 20  SpO2 98%   Physical Exam  Constitutional: He is oriented to person, place, and time. He appears well-developed and well-nourished.  HENT:  Head: Normocephalic and atraumatic.  Mouth/Throat: Oropharynx is clear and moist.  Teeth largely in fair dentition, right lower lateral incisor is slightly loose but still well rooted in gums, surrounding gingiva slightly swollen and erythematous, no facial or neck swelling, handling secretions appropriately, no trismus  Eyes: Conjunctivae and EOM are normal. Pupils are equal, round, and reactive to light.  Neck: Normal range of motion.  Cardiovascular: Normal rate, regular rhythm and normal heart sounds.   Pulmonary/Chest: Effort normal and breath sounds normal.  Musculoskeletal: Normal range of motion.  Neurological: He is alert and oriented to person, place, and time.  Skin: Skin is warm and dry.  Psychiatric: He has a normal mood and affect.  Nursing note and vitals reviewed.   ED Course  Procedures   DIAGNOSTIC STUDIES:  Oxygen Saturation is 98% on RA,  normal by my interpretation.    COORDINATION OF CARE:  1:31 PM Advised pt to follow up with his dentist. Will order pain meds and discharge with antibiotics. Discussed treatment plan with pt at bedside and pt agreed to plan.  Labs Review Labs Reviewed - No data to display  Imaging Review No results found.   EKG Interpretation None      MDM   Final diagnoses:  Pain, dental  Loosening of tooth   Dental pain secondary to lower tooth becoming more loose. Gums do appear somewhat swollen and erythematous without definitive abscess formation.  No facial or neck swelling to suggest  ludwigs angina.  VSS.  Patient will be d/c with pain meds and abx.  FU with dentist.  Discussed plan with patient, he/she acknowledged understanding and agreed with plan of care.  Return precautions given for new or worsening symptoms.  I personally performed the services described in this documentation, which was scribed in my presence. The recorded information has been reviewed and is accurate.  Larene Pickett, PA-C 08/10/14 [place], MD 08/10/14 1346

## 2014-08-20 DIAGNOSIS — F411 Generalized anxiety disorder: Secondary | ICD-10-CM | POA: Diagnosis not present

## 2014-08-20 DIAGNOSIS — E785 Hyperlipidemia, unspecified: Secondary | ICD-10-CM | POA: Diagnosis not present

## 2014-08-20 DIAGNOSIS — Z1389 Encounter for screening for other disorder: Secondary | ICD-10-CM | POA: Diagnosis not present

## 2014-08-20 DIAGNOSIS — E291 Testicular hypofunction: Secondary | ICD-10-CM | POA: Diagnosis not present

## 2014-08-20 DIAGNOSIS — Z6830 Body mass index (BMI) 30.0-30.9, adult: Secondary | ICD-10-CM | POA: Diagnosis not present

## 2014-08-20 DIAGNOSIS — M159 Polyosteoarthritis, unspecified: Secondary | ICD-10-CM | POA: Diagnosis not present

## 2014-08-20 DIAGNOSIS — N529 Male erectile dysfunction, unspecified: Secondary | ICD-10-CM | POA: Diagnosis not present

## 2014-08-20 DIAGNOSIS — I10 Essential (primary) hypertension: Secondary | ICD-10-CM | POA: Diagnosis not present

## 2014-09-19 DIAGNOSIS — M159 Polyosteoarthritis, unspecified: Secondary | ICD-10-CM | POA: Diagnosis not present

## 2014-09-19 DIAGNOSIS — N529 Male erectile dysfunction, unspecified: Secondary | ICD-10-CM | POA: Diagnosis not present

## 2014-09-19 DIAGNOSIS — Z720 Tobacco use: Secondary | ICD-10-CM | POA: Diagnosis not present

## 2014-09-19 DIAGNOSIS — R600 Localized edema: Secondary | ICD-10-CM | POA: Diagnosis not present

## 2014-09-19 DIAGNOSIS — E785 Hyperlipidemia, unspecified: Secondary | ICD-10-CM | POA: Diagnosis not present

## 2014-09-19 DIAGNOSIS — Z6830 Body mass index (BMI) 30.0-30.9, adult: Secondary | ICD-10-CM | POA: Diagnosis not present

## 2014-09-19 DIAGNOSIS — E291 Testicular hypofunction: Secondary | ICD-10-CM | POA: Diagnosis not present

## 2014-09-19 DIAGNOSIS — I10 Essential (primary) hypertension: Secondary | ICD-10-CM | POA: Diagnosis not present

## 2014-09-19 DIAGNOSIS — F411 Generalized anxiety disorder: Secondary | ICD-10-CM | POA: Diagnosis not present

## 2015-01-26 ENCOUNTER — Emergency Department (HOSPITAL_COMMUNITY)
Admission: EM | Admit: 2015-01-26 | Discharge: 2015-01-26 | Disposition: A | Discharge status: Home / Self Care | Payer: Medicare Other | Attending: Emergency Medicine | Admitting: Emergency Medicine

## 2015-01-26 ENCOUNTER — Encounter (HOSPITAL_COMMUNITY): Payer: Self-pay

## 2015-01-26 ENCOUNTER — Emergency Department (HOSPITAL_COMMUNITY): Payer: Medicare Other

## 2015-01-26 DIAGNOSIS — Y998 Other external cause status: Secondary | ICD-10-CM | POA: Insufficient documentation

## 2015-01-26 DIAGNOSIS — Y9289 Other specified places as the place of occurrence of the external cause: Secondary | ICD-10-CM | POA: Insufficient documentation

## 2015-01-26 DIAGNOSIS — Z79899 Other long term (current) drug therapy: Secondary | ICD-10-CM | POA: Diagnosis not present

## 2015-01-26 DIAGNOSIS — R1030 Lower abdominal pain, unspecified: Secondary | ICD-10-CM | POA: Diagnosis not present

## 2015-01-26 DIAGNOSIS — F17210 Nicotine dependence, cigarettes, uncomplicated: Secondary | ICD-10-CM | POA: Diagnosis not present

## 2015-01-26 DIAGNOSIS — I10 Essential (primary) hypertension: Secondary | ICD-10-CM | POA: Insufficient documentation

## 2015-01-26 DIAGNOSIS — W1849XA Other slipping, tripping and stumbling without falling, initial encounter: Secondary | ICD-10-CM | POA: Insufficient documentation

## 2015-01-26 DIAGNOSIS — M1990 Unspecified osteoarthritis, unspecified site: Secondary | ICD-10-CM | POA: Diagnosis not present

## 2015-01-26 DIAGNOSIS — Y9367 Activity, basketball: Secondary | ICD-10-CM | POA: Diagnosis not present

## 2015-01-26 DIAGNOSIS — S3991XA Unspecified injury of abdomen, initial encounter: Secondary | ICD-10-CM

## 2015-01-26 DIAGNOSIS — R030 Elevated blood-pressure reading, without diagnosis of hypertension: Secondary | ICD-10-CM | POA: Diagnosis not present

## 2015-01-26 DIAGNOSIS — N499 Inflammatory disorder of unspecified male genital organ: Secondary | ICD-10-CM | POA: Diagnosis not present

## 2015-01-26 MED ORDER — NAPROXEN 500 MG PO TABS: 500.0000 mg | ORAL_TABLET | Freq: Two times a day (BID) | ORAL | Status: DC

## 2015-01-26 MED ORDER — METHOCARBAMOL 500 MG PO TABS: 500.0000 mg | ORAL_TABLET | Freq: Two times a day (BID) | ORAL | Status: DC

## 2015-01-26 MED ORDER — OXYCODONE-ACETAMINOPHEN 5-325 MG PO TABS
2.0000 | ORAL_TABLET | Freq: Once | ORAL | Status: AC
  Administered 2015-01-26: 2 via ORAL
  Filled 2015-01-26: qty 2

## 2015-01-26 MED ORDER — HYDROCODONE-ACETAMINOPHEN 5-325 MG PO TABS: 2.0000 | ORAL_TABLET | ORAL | Status: DC | PRN

## 2015-01-26 NOTE — Discharge Instructions (Signed)
Minimize ambulation and use of your right leg until your symptoms improved.  Crutches as needed.

## 2015-01-26 NOTE — ED Provider Notes (Signed)
CSN: [id number]     Arrival date & time 01/26/15  0818 History   First MD Initiated Contact with Patient 01/26/15 [phone]     Chief Complaint  Patient presents with  . Groin Pain      HPI  Patient presents for evaluation with 3 days of right groin pain. Friday, 3 days ago he was playing basketball with his family. He states he went to shoot, and his right foot slipped laterally on him, causing a hyper abduction of his hip.  He did not actually fall to the ground. He states he "caught himself". He had some discomfort in his left back that day. However this was not bad. He was not able to continue playing. He went to sleep. Saturday morning, 2 days ago he awakened with pain in his right groin. States he cannot move his right hip without a great deal of pain. He is able to bear weight. However movement is painful to him.  No radicular type pain with radiation. No numbness or weakness. No history of back, or hip abnormalities.  Past Medical History  Diagnosis Date  . Hypertension   . Bradycardia   . Arthritis    Past Surgical History  Procedure Laterality Date  . Hernia repair     Family History  Problem Relation Age of Onset  . Cancer Mother   . Hypertension Mother   . Diabetes Mother   . Cancer Father    Social History  Substance Use Topics  . Smoking status: Current Every Day Smoker -- 0.50 packs/day for 25 years    Types: Cigarettes  . Smokeless tobacco: Never Used  . Alcohol Use: No    Review of Systems  Constitutional: Negative for fever, chills, diaphoresis, appetite change and fatigue.  HENT: Negative for mouth sores, sore throat and trouble swallowing.   Eyes: Negative for visual disturbance.  Respiratory: Negative for cough, chest tightness, shortness of breath and wheezing.   Cardiovascular: Negative for chest pain.  Gastrointestinal: Negative for nausea, vomiting, abdominal pain, diarrhea and abdominal distention.  Endocrine: Negative for polydipsia, polyphagia  and polyuria.  Genitourinary: Negative for dysuria, frequency and hematuria.  Musculoskeletal: Positive for myalgias, arthralgias and gait problem.  Skin: Negative for color change, pallor and rash.  Neurological: Negative for dizziness, syncope, light-headedness and headaches.  Hematological: Does not bruise/bleed easily.  Psychiatric/Behavioral: Negative for behavioral problems and confusion.      Allergies  Sulfa antibiotics  Home Medications   Prior to Admission medications   Medication Sig Start Date End Date Taking? Authorizing Provider  amLODipine (NORVASC) 10 MG tablet Take 10 mg by mouth every morning.    Yes Historical Provider, MD  atorvastatin (LIPITOR) 10 MG tablet Take 10 mg by mouth every morning.   Yes Historical Provider, MD  ibuprofen (ADVIL,MOTRIN) 200 MG tablet Take 200 mg by mouth every 6 (six) hours as needed for fever, headache, mild pain, moderate pain or cramping.   Yes Historical Provider, MD  HYDROcodone-acetaminophen (NORCO/VICODIN) 5-325 MG tablet Take 2 tablets by mouth every 4 (four) hours as needed. 01/26/15   Tanna Furry, MD  methocarbamol (ROBAXIN) 500 MG tablet Take 1 tablet (500 mg total) by mouth 2 (two) times daily. 01/26/15   Tanna Furry, MD  naproxen (NAPROSYN) 500 MG tablet Take 1 tablet (500 mg total) by mouth 2 (two) times daily. 01/26/15   Tanna Furry, MD  pantoprazole (PROTONIX) 40 MG tablet Take 1 tablet (40 mg total) by mouth daily. Patient not taking: Reported  on 01/26/2015 10/08/13   Lajean Saver, MD  penicillin v potassium (VEETID) 500 MG tablet Take 1 tablet (500 mg total) by mouth 4 (four) times daily. Patient not taking: Reported on 01/26/2015 08/10/14   Larene Pickett, PA-C   BP 143/78 mmHg  Pulse 43  Temp(Src) 98.1 F (36.7 C) (Oral)  Resp 16  Ht 5\' 11"  (1.803 m)  Wt 213 lb (96.616 kg)  BMI 29.72 kg/m2  SpO2 95% Physical Exam  Constitutional: He is oriented to person, place, and time. He appears well-developed and  well-nourished. No distress.  HENT:  Head: Normocephalic.  Eyes: Conjunctivae are normal. Pupils are equal, round, and reactive to light. No scleral icterus.  Neck: Normal range of motion. Neck supple. No thyromegaly present.  Cardiovascular: Normal rate and regular rhythm.  Exam reveals no gallop and no friction rub.   No murmur heard. Pulmonary/Chest: Effort normal and breath sounds normal. No respiratory distress. He has no wheezes. He has no rales.  Abdominal: Soft. Bowel sounds are normal. He exhibits no distension. There is no tenderness. There is no rebound.  Musculoskeletal: Normal range of motion.       Legs: Pain tenderness from the pubic deficits right lateral injury and Dr. area of the right upper leg. Pain with active or passive flexion at the hip. Pain with resistant internal rotation or adduction of the hip.  Neurological: He is alert and oriented to person, place, and time.  Skin: Skin is warm and dry. No rash noted.  Psychiatric: He has a normal mood and affect. His behavior is normal.    ED Course  Procedures (including critical care time) Labs Review Labs Reviewed - No data to display  Imaging Review Dg Pelvis 1-2 Views  01/26/2015  CLINICAL DATA:  Right groin pain after the patient slipped and fell 2 days ago. EXAM: PELVIS - 1-2 VIEW COMPARISON:  Lumbar radiographs dated 02/05/2005 FINDINGS: There is no evidence of pelvic fracture or diastasis. Slight arthritic changes of the sacroiliac joints. No soft tissue calcifications. IMPRESSION: No acute abnormality. Slight arthritic changes of the sacroiliac joints. Electronically Signed   By: Lorriane Shire M.D.   On: 01/26/2015 09:35   I have personally reviewed and evaluated these images and lab results as part of my medical decision-making.   EKG Interpretation None      MDM   Final diagnoses:  Groin injury, initial encounter   No sign of avulsion from the pelvis. Plan will be expectant management. Conservative  treatment. Hyperflexion, or abduction.    Tanna Furry, MD 01/26/15 1048

## 2015-01-26 NOTE — ED Notes (Addendum)
PT was playing basketball with his son and was going for a layup and when he landed, felt pain in his groin and back. When he woke up on Saturday, the pain was worse and he was unable to ambulate properly. Pain is in his right groin and persists today. Pain 8/10. No obvious swelling noted. Pt has tried ibuprofen and aleve with no relief. Denies direct impact to groin area.

## 2015-01-26 NOTE — ED Notes (Signed)
Bed: WA17 Expected date:  Expected time:  Means of arrival:  Comments: Ems-groin pain

## 2015-01-26 NOTE — ED Notes (Signed)
Pt has a documented history of Bradycardia.

## 2015-07-22 IMAGING — CR DG CHEST 2V
2 series · 2 of 2 positions shown · non-contrast
Comparison: 08/09/2010.

CLINICAL DATA: Chest pain.  Hypertension.

EXAM:
CHEST  2 VIEW

[w chest pa]
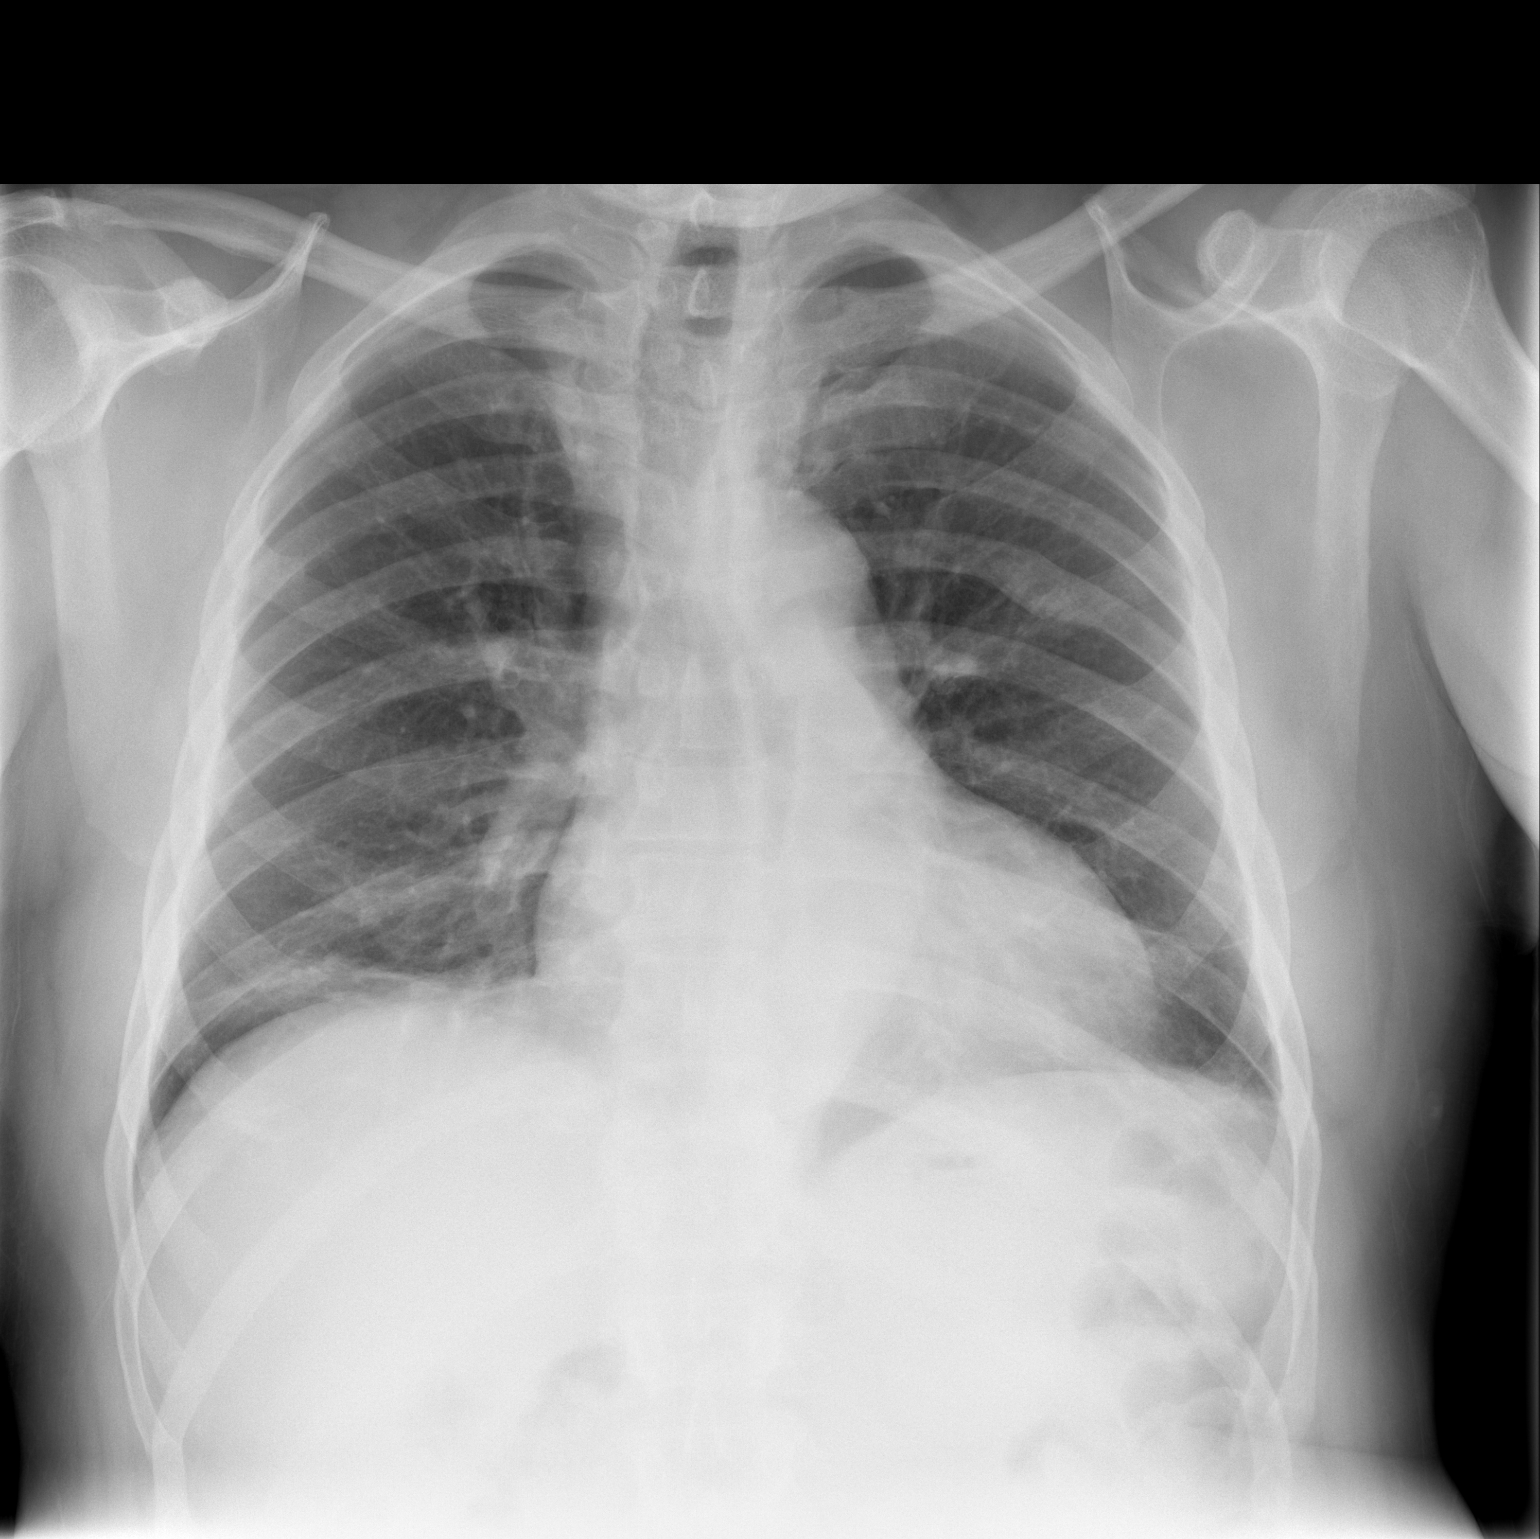

[w chest lat]
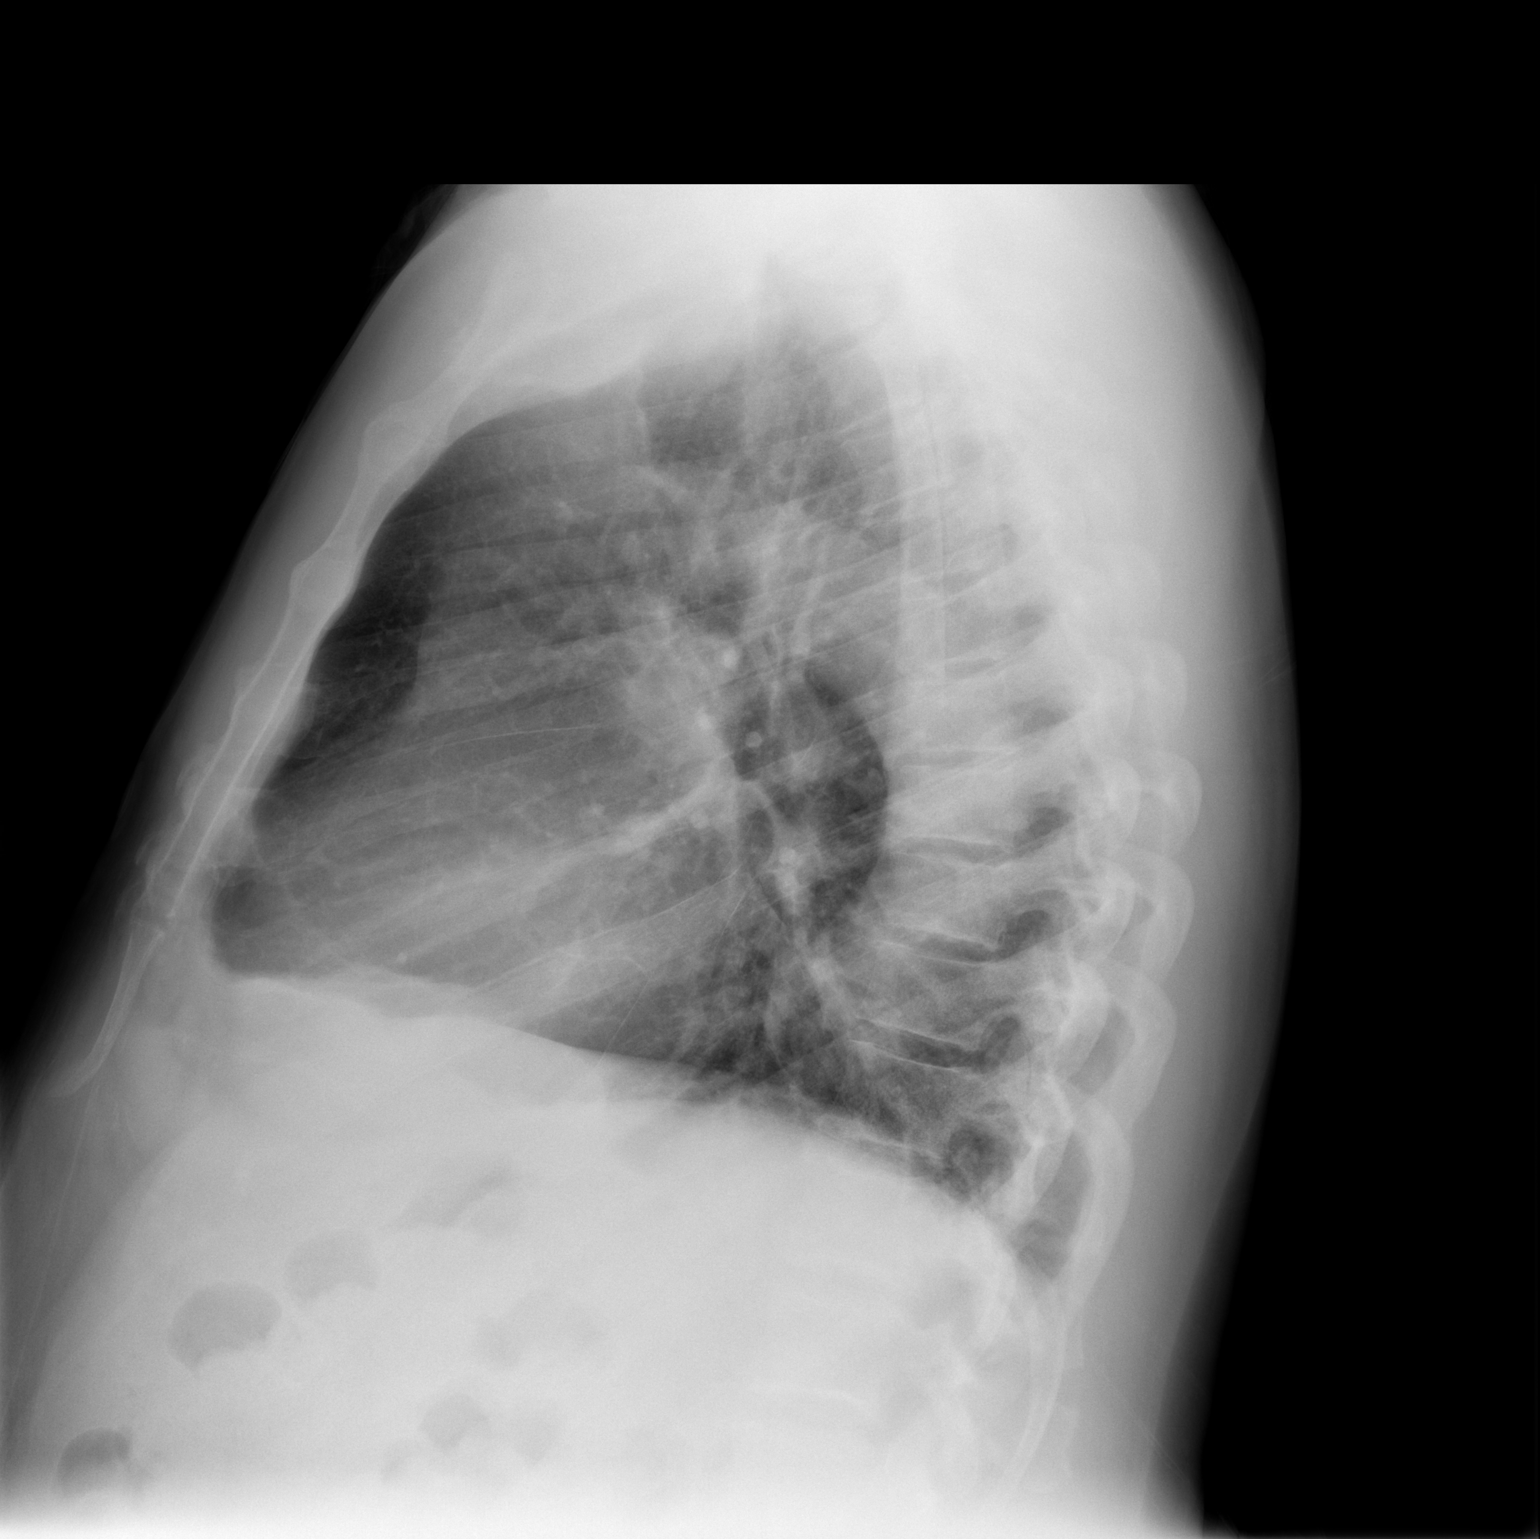

[2 of 2 positions shown; findings below may reference images not displayed]

FINDINGS: Bibasilar atelectasis persists [REDACTED] be slightly worsened priors,
strictly on the right. Mildly enlarged cardiomediastinal silhouette.
Calcified tortuous aorta. Old left rib fracture #6. No definite
active infiltrates or failure.
IMPRESSION: No active cardiopulmonary disease. Slight increase bibasilar
atelectasis. Cardiomegaly.

## 2015-07-23 IMAGING — CT CT ANGIO CHEST
1 of 2 series · 19 of 32 positions shown · IV contrast (OMNIPAQUE 300)
Comparison: None.

CLINICAL DATA: Chest pain and shortness of breath

EXAM:
CT ANGIOGRAPHY CHEST WITH CONTRAST
TECHNIQUE: Multidetector CT imaging of the chest was performed using the
standard protocol during bolus administration of intravenous
contrast. Multiplanar CT image reconstructions and MIPs were
obtained to evaluate the vascular anatomy.
CONTRAST:  100mL OMNIPAQUE IOHEXOL 350 MG/ML SOLN

[Series 8: thins for pacs · axial · 0.74mm/px · z∈[+1458,+1710]mm · 19 of 281 slices shown]
[im 15/281  lung]
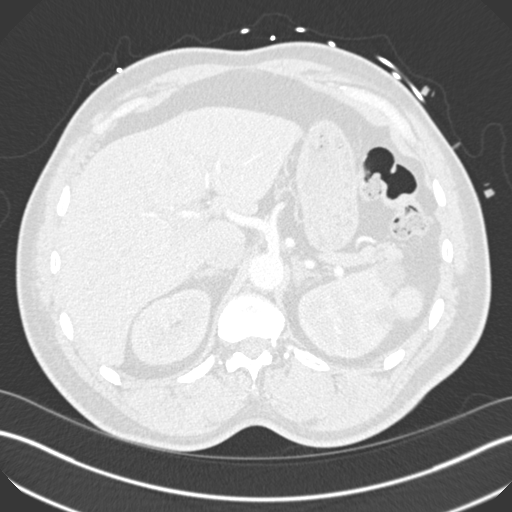
[im 29/281  mediastinal]
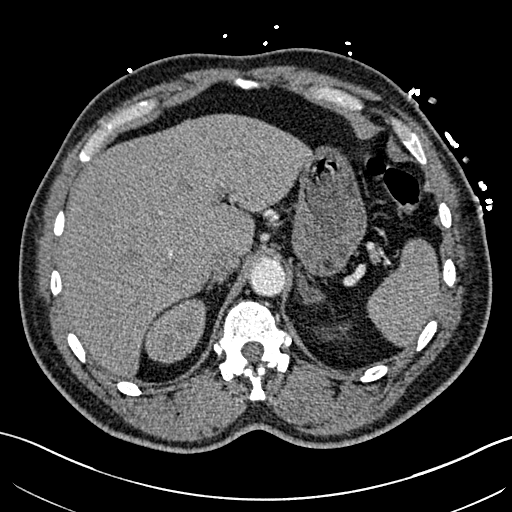
[im 43/281  lung]
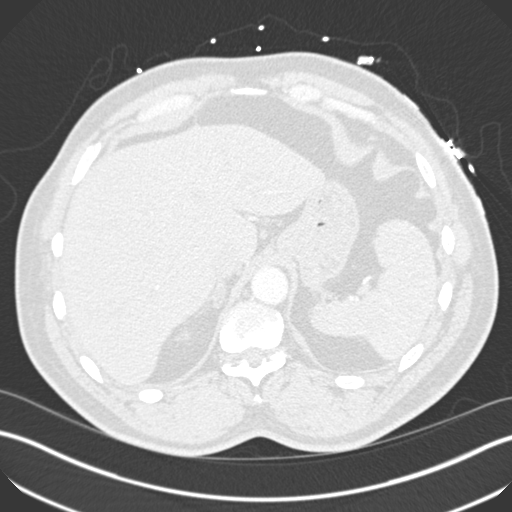
[im 71/281  mediastinal]
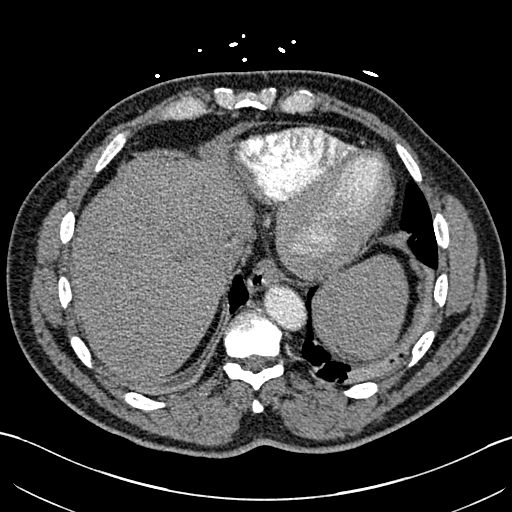
[im 85/281  lung]
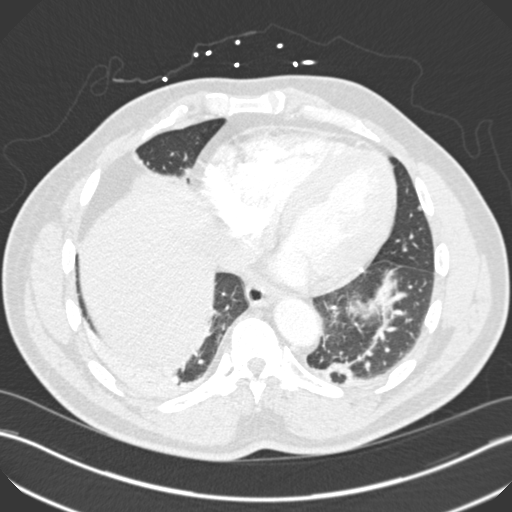
[im 94/281  mediastinal]
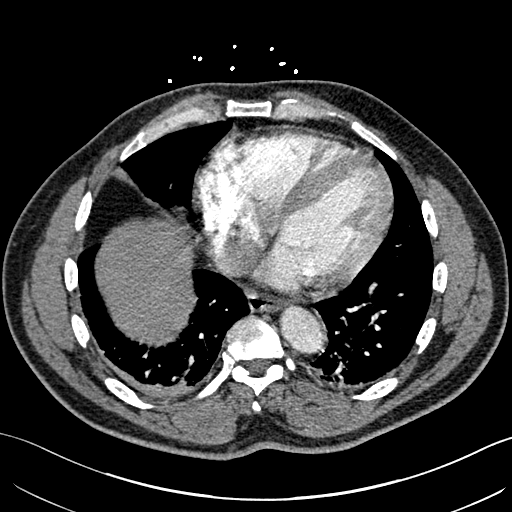
[im 99/281  lung]
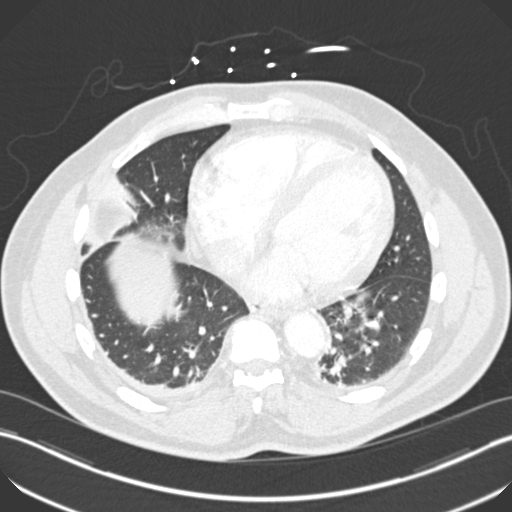
[im 113/281  mediastinal]
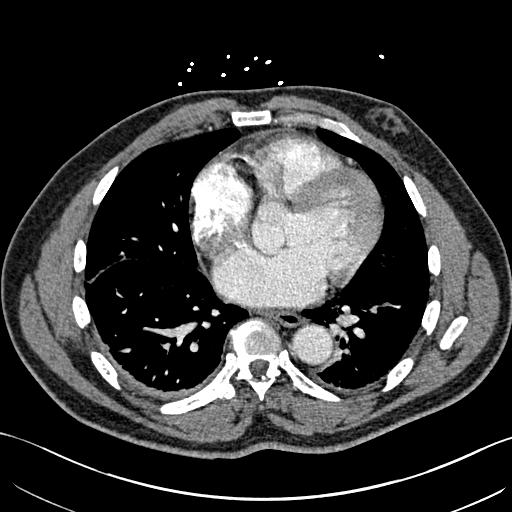
[im 127/281  lung]
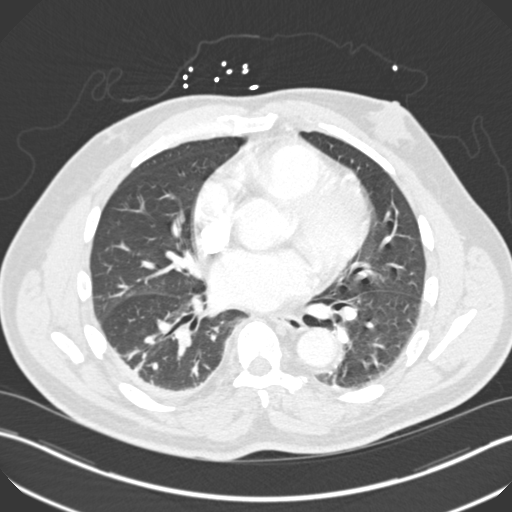
[im 141/281  mediastinal]
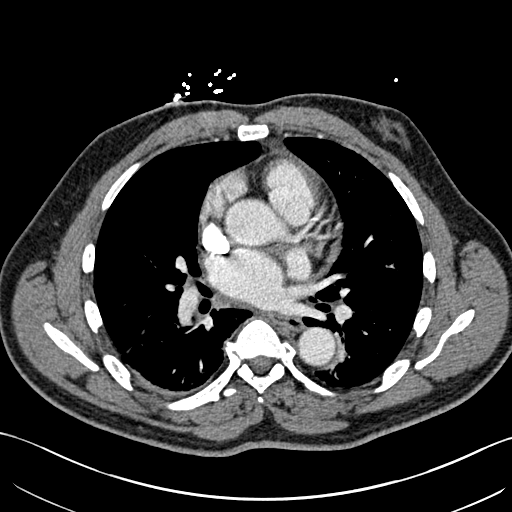
[im 155/281  lung]
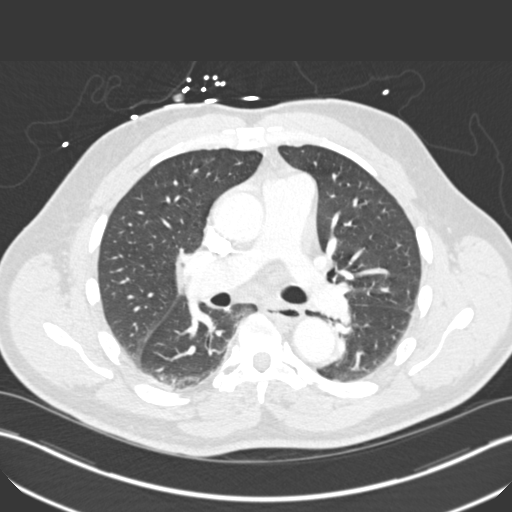
[im 169/281  mediastinal]
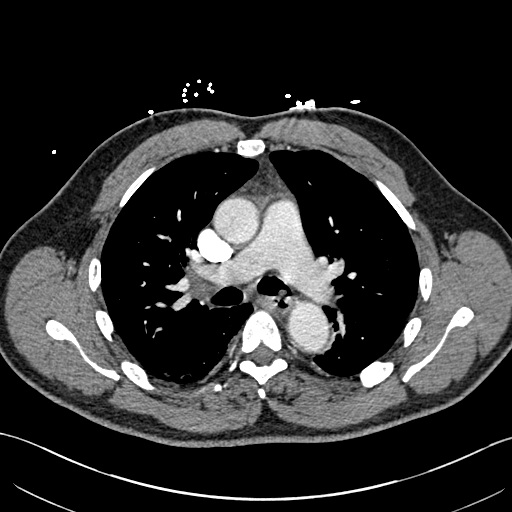
[im 183/281  lung]
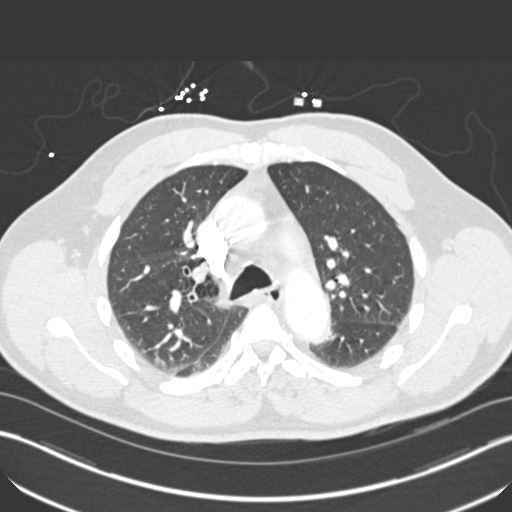
[im 187/281  mediastinal]
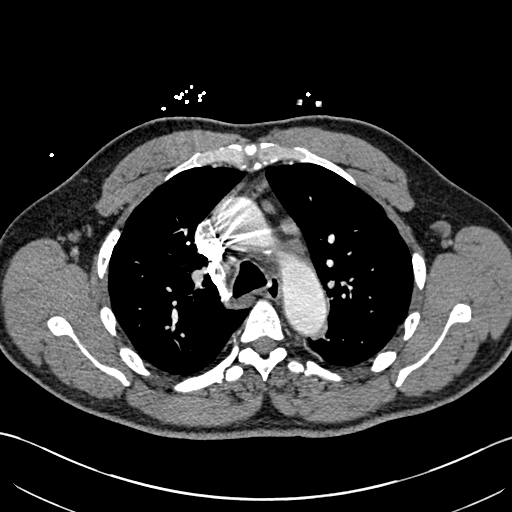
[im 197/281  lung]
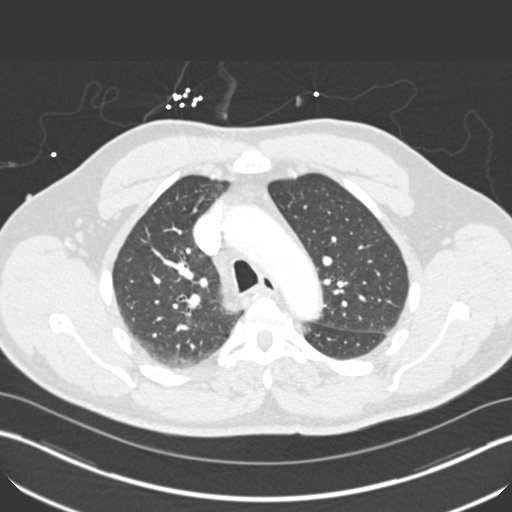
[im 211/281  mediastinal]
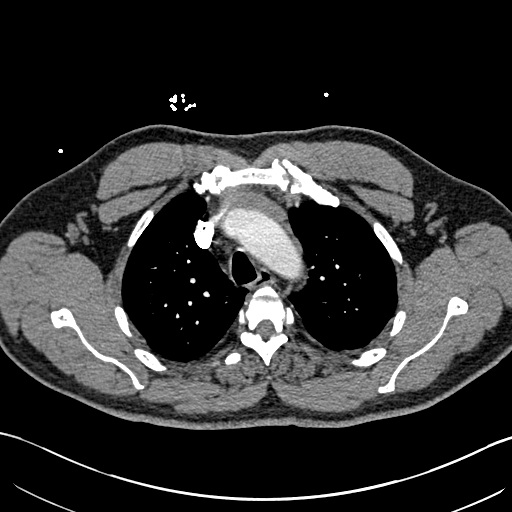
[im 239/281  lung]
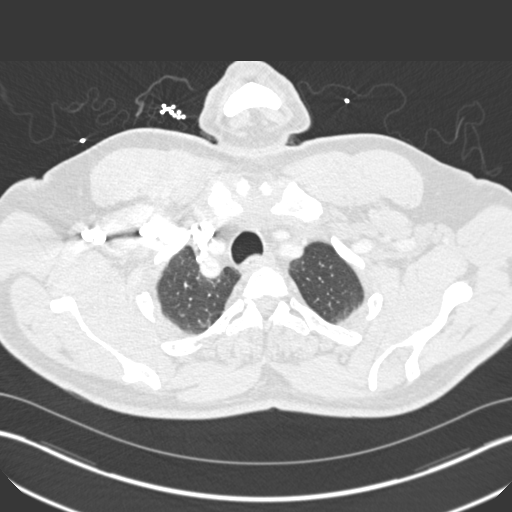
[im 253/281  mediastinal]
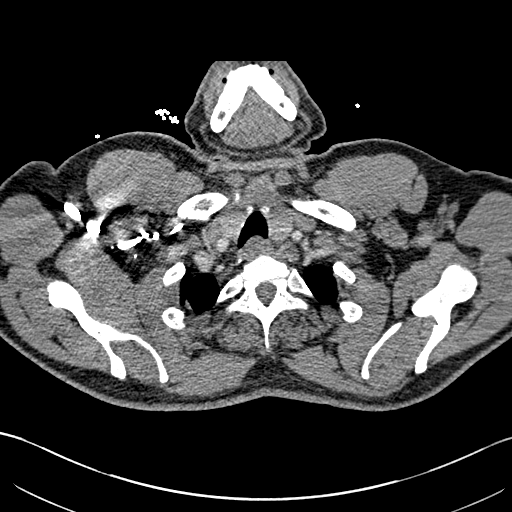
[im 267/281  lung]
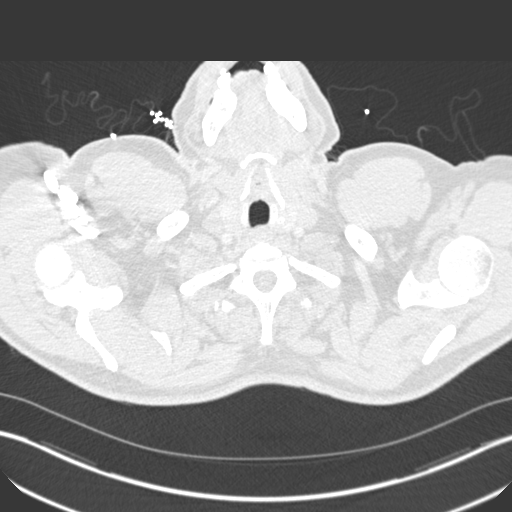

[19 of 32 positions shown; findings below may reference images not displayed]

FINDINGS: THORACIC INLET/BODY WALL:

Subareolar density which is asymmetric to the left, but the
morphology is most consistent with gynecomastia.

MEDIASTINUM:

Normal heart size. No pericardial effusion. Proximal LAD coronary
atherosclerotic calcification is visible. No convincing pulmonary
embolism, as permitted by decreased sensitivity from bolus
dispersion and intermittent motion. An apparent filling defect in
the anterior segment right upper lobe has the appearance of streak
artifact on reformats. No aortic aneurysm or dissection. No
adenopathy.

LUNG WINDOWS:

Bandlike opacities in the lower lungs are consistent with mild
atelectasis. No suspected consolidation. No pulmonary edema, pleural
effusion, or pneumothorax.

UPPER ABDOMEN:

Two low-density left renal lesions are partly imaged, measuring
and 2.1 cm individually. These most likely represent cysts.

OSSEOUS:

No acute fracture.  No suspicious lytic or blastic lesions.

Review of the MIP images confirms the above findings.
IMPRESSION: 1. No evidence of pulmonary embolism.
2. Bibasilar atelectasis.
3. Coronary atherosclerosis.

## 2016-02-18 ENCOUNTER — Encounter: Payer: Self-pay | Admitting: Internal Medicine

## 2016-02-18 NOTE — Progress Notes (Signed)
Opened in error; Disregard.

## 2016-06-15 ENCOUNTER — Emergency Department (HOSPITAL_COMMUNITY)
Admission: EM | Admit: 2016-06-15 | Discharge: 2016-06-15 | Disposition: A | Discharge status: Home / Self Care | Payer: Medicare Other | Attending: Emergency Medicine | Admitting: Emergency Medicine

## 2016-06-15 ENCOUNTER — Encounter (HOSPITAL_COMMUNITY): Payer: Self-pay | Admitting: *Deleted

## 2016-06-15 ENCOUNTER — Emergency Department (HOSPITAL_COMMUNITY): Payer: Medicare Other

## 2016-06-15 DIAGNOSIS — Y929 Unspecified place or not applicable: Secondary | ICD-10-CM | POA: Insufficient documentation

## 2016-06-15 DIAGNOSIS — Z79899 Other long term (current) drug therapy: Secondary | ICD-10-CM | POA: Insufficient documentation

## 2016-06-15 DIAGNOSIS — F17210 Nicotine dependence, cigarettes, uncomplicated: Secondary | ICD-10-CM | POA: Diagnosis not present

## 2016-06-15 DIAGNOSIS — I10 Essential (primary) hypertension: Secondary | ICD-10-CM | POA: Insufficient documentation

## 2016-06-15 DIAGNOSIS — R9389 Abnormal findings on diagnostic imaging of other specified body structures: Secondary | ICD-10-CM

## 2016-06-15 DIAGNOSIS — X501XXA Overexertion from prolonged static or awkward postures, initial encounter: Secondary | ICD-10-CM | POA: Insufficient documentation

## 2016-06-15 DIAGNOSIS — M7989 Other specified soft tissue disorders: Secondary | ICD-10-CM

## 2016-06-15 DIAGNOSIS — Y939 Activity, unspecified: Secondary | ICD-10-CM | POA: Diagnosis not present

## 2016-06-15 DIAGNOSIS — R938 Abnormal findings on diagnostic imaging of other specified body structures: Secondary | ICD-10-CM | POA: Diagnosis not present

## 2016-06-15 DIAGNOSIS — M25532 Pain in left wrist: Secondary | ICD-10-CM | POA: Diagnosis not present

## 2016-06-15 DIAGNOSIS — M79632 Pain in left forearm: Secondary | ICD-10-CM

## 2016-06-15 DIAGNOSIS — Y999 Unspecified external cause status: Secondary | ICD-10-CM | POA: Diagnosis not present

## 2016-06-15 MED ORDER — NAPROXEN 500 MG PO TABS: 500.0000 mg | ORAL_TABLET | Freq: Two times a day (BID) | ORAL | 0 refills | Status: DC | PRN

## 2016-06-15 MED ORDER — ACETAMINOPHEN 325 MG PO TABS
650.0000 mg | ORAL_TABLET | Freq: Once | ORAL | Status: AC
  Administered 2016-06-15: 650 mg via ORAL
  Filled 2016-06-15: qty 2

## 2016-06-15 NOTE — ED Notes (Signed)
Patient was alert, oriented and stable upon discharge. RN went over AVS and patient had no further questions.  

## 2016-06-15 NOTE — ED Triage Notes (Signed)
Pt reports pain and swelling in the left wrist x 3 days.  Pt denies any injuries.  Pt reports taking ibuprofen and using ice without any relief.  Pt reports he is able to move wrist but has lots of pain.  Swelling noted to wrist and down forearm. Strong radial pulse palpated on left arm. Pt a/o x 4 and ambulatory.

## 2016-06-15 NOTE — ED Provider Notes (Signed)
[facility] DEPT Provider Note   CSN: [id number] Arrival date & time: 06/15/16  1733  By signing my name below, I, Jeanell Sparrow, attest that this documentation has been prepared under the direction and in the presence of non-physician practitioner, [address], PA-C. Electronically Signed: Jeanell Sparrow, Scribe. 06/15/2016. 5:56 PM.  History   Chief Complaint Chief Complaint  Patient presents with  . Wrist Pain    left   The history is provided by the patient and medical records. No language interpreter was used.  Wrist Pain  This is a new problem. The current episode started more than 2 days ago. The problem occurs constantly. The problem has not changed since onset.Pertinent negatives include no chest pain, no abdominal pain and no shortness of breath. The symptoms are aggravated by bending and twisting. Nothing relieves the symptoms. He has tried a warm compress and a cold compress (ibuprofen) for the symptoms. The treatment provided no relief.   HPI Comments: Cashel Bellina is a 61 y.o. male with a PMHx of Arthritis (neck/back/hands), HTN, and bradycardia, who presents to the Emergency Department complaining of constant moderate left wrist/forearm pain that started about 3 days ago. No known injury but reports that he recently was stripping floors so he did a lot of repetitive movements with his hands/wrists. He describes the pain as 7/10 constant, throbbing, L forearm/wrist pain radiating to his L elbow/upper arm occasionally, exacerbated by left wrist movements such as zipping his pants up or turning his wrist, and unrelieved by leftover Amoxicillin rx, ibuprofen, heat, and ice. He reports associated left wrist/forearm swelling. Denies hx of gout, family hx of gout, recent travel/surgery/immobilization, recent IV or needle sticks/venipunctures, personal or family hx of DVT/PE, fevers, chills, CP, SOB, leg swelling, abd pain, N/V/D/C, hematuria, dysuria, myalgias, arthralgias,  redness/warmth to area, itching/rashes, numbness, tingling, focal weakness, or any other complaints at this time.  [facility]: [facility]    Past Medical History:  Diagnosis Date  . Arthritis   . Bradycardia   . Hypertension     There are no active problems to display for this patient.   Past Surgical History:  Procedure Laterality Date  . HERNIA REPAIR         Home Medications    Prior to Admission medications   Medication Sig Start Date End Date Taking? Authorizing Provider  amLODipine (NORVASC) 10 MG tablet Take 10 mg by mouth every morning.     Historical Provider, MD  atorvastatin (LIPITOR) 10 MG tablet Take 10 mg by mouth every morning.    Historical Provider, MD  HYDROcodone-acetaminophen (NORCO/VICODIN) 5-325 MG tablet Take 2 tablets by mouth every 4 (four) hours as needed. 01/26/15   Tanna Furry, MD  ibuprofen (ADVIL,MOTRIN) 200 MG tablet Take 200 mg by mouth every 6 (six) hours as needed for fever, headache, mild pain, moderate pain or cramping.    Historical Provider, MD  methocarbamol (ROBAXIN) 500 MG tablet Take 1 tablet (500 mg total) by mouth 2 (two) times daily. 01/26/15   Tanna Furry, MD  naproxen (NAPROSYN) 500 MG tablet Take 1 tablet (500 mg total) by mouth 2 (two) times daily. 01/26/15   Tanna Furry, MD  pantoprazole (PROTONIX) 40 MG tablet Take 1 tablet (40 mg total) by mouth daily. Patient not taking: Reported on 01/26/2015 10/08/13   Lajean Saver, MD  penicillin v potassium (VEETID) 500 MG tablet Take 1 tablet (500 mg total) by mouth 4 (four) times daily. Patient not taking: Reported on 01/26/2015 08/10/14  Larene Pickett, PA-C    Family History Family History  Problem Relation Age of Onset  . Cancer Mother   . Hypertension Mother   . Diabetes Mother   . Cancer Father     Social History Social History  Substance Use Topics  . Smoking status: Current Every Day Smoker    Packs/day: 0.50    Years: 25.00    Types: Cigarettes  .  Smokeless tobacco: Never Used  . Alcohol use No     Allergies   Sulfa antibiotics   Review of Systems Review of Systems  Constitutional: Negative for chills and fever.  Respiratory: Negative for shortness of breath.   Cardiovascular: Negative for chest pain and leg swelling.  Gastrointestinal: Negative for abdominal pain, constipation, diarrhea, nausea and vomiting.  Genitourinary: Negative for dysuria and hematuria.  Musculoskeletal: Positive for joint swelling and myalgias (Left wrist). Negative for arthralgias.  Skin: Negative for color change.  Allergic/Immunologic: Negative for immunocompromised state.  Neurological: Negative for weakness and numbness.  Psychiatric/Behavioral: Negative for confusion.   10 Systems reviewed and all are negative for acute change except as noted in the HPI.  Physical Exam Updated Vital Signs BP (!) 138/91 (BP Location: Right Arm)   Pulse (!) 55   Temp 97.5 F (36.4 C) (Oral)   Resp 16   Wt 203 lb (92.1 kg)   SpO2 100%   BMI 28.31 kg/m   Physical Exam  Constitutional: He is oriented to person, place, and time. Vital signs are normal. He appears well-developed and well-nourished.  Non-toxic appearance. No distress.  Afebrile, nontoxic, NAD  HENT:  Head: Normocephalic and atraumatic.  Mouth/Throat: Mucous membranes are normal.  Eyes: Conjunctivae and EOM are normal. Right eye exhibits no discharge. Left eye exhibits no discharge.  Neck: Normal range of motion. Neck supple.  Cardiovascular: Intact distal pulses.  Bradycardia present.   Sinus bradycardia, similar to prior visits  Pulmonary/Chest: Effort normal. No respiratory distress.  Abdominal: Normal appearance. He exhibits no distension.  Musculoskeletal: Normal range of motion.       Left wrist: He exhibits tenderness, bony tenderness and swelling. He exhibits normal range of motion, no effusion, no crepitus, no deformity and no laceration.  Left forearm and wrist with FROM intact  with mild TTP to the ulnar aspect of the distal forearm just proximal to the ulnar styloid, with mild swelling to this area but no erythema or warmth, no pitting edema, with no focal bony or joint line TTP of the wrist itself or hand, no joint effusion or crepitus/deformity, skin intact with no rashes or skin changes, grip strength and sensation grossly intact, distal pulses intact, soft compartments.   Neurological: He is alert and oriented to person, place, and time. He has normal strength. No sensory deficit.  Skin: Skin is warm, dry and intact. No rash noted.  Psychiatric: He has a normal mood and affect.  Nursing note and vitals reviewed.    ED Treatments / Results  DIAGNOSTIC STUDIES: Oxygen Saturation is 100% on RA, normal by my interpretation.    COORDINATION OF CARE: 6:00 PM- Pt advised of plan for treatment and pt agrees.  Labs (all labs ordered are listed, but only abnormal results are displayed) Labs Reviewed - No data to display  EKG  EKG Interpretation None       Radiology Dg Forearm Left  Result Date: 06/15/2016 CLINICAL DATA:  61 year old with pain and swelling involving the distal left forearm and left wrist along the ulnar  aspect over the past 3 days. No known injuries. Decreased range of motion. EXAM: LEFT FOREARM - 2 VIEW COMPARISON:  None. FINDINGS: No evidence of acute, subacute or healed fractures. Circumscribed lucent lesion involving the distal ulnar epiphysis extending to the articular surface. Well-preserved bone mineral density. Mild radiocarpal joint space narrowing at the wrist. Visualized elbow joint intact. IMPRESSION: 1. No acute or subacute osseous abnormality. 2. Osteoarthritis involving the wrist. 3. Solitary lucent lesion involving the distal ulnar epiphysis extending to the articular surface. Given the fact that this is a painful lesion, further evaluation with an MRI of the left wrist without and with contrast is recommended. Differential diagnosis  includes enchondroma, low grade chondrosarcoma, giant cell tumor, geode, and infection. Metastasis and multiple myeloma is felt less likely in the absence of other lesions. Electronically Signed   By: Evangeline Dakin M.D.   On: 06/15/2016 18:44    Procedures Procedures (including critical care time)  Medications Ordered in ED Medications  acetaminophen (TYLENOL) tablet 650 mg (650 mg Oral Given 06/15/16 1826)     Initial Impression / Assessment and Plan / ED Course  I have reviewed the triage vital signs and the nursing notes.  Pertinent labs & imaging results that were available during my care of the patient were reviewed by me and considered in my medical decision making (see chart for details).     61 y.o. male here with atraumatic L forearm/wrist pain/swelling. Mildly tender along distal ulnar aspect of forearm, not quite to the wrist, mild swelling over tender area, no erythema/warmth, no pitting edema. NVI with soft compartments. No RFs for DVT, and given exam, doubt this as a cause. No evidence of infection/cellulitis. +Recent repetitive movements, could just be from that. Will obtain xray to eval for underlying occult injury, and/or arthritic findings. Doesn't look like gout but that's always on the list of differentials with a tender swollen joint. Will give tylenol and reassess after xray returns.   6:57 PM Xray shows no acute findings, mild arthritis in the wrist, but shows this solitary lucency of distal ulnar epiphysis, recommends MRI for further differentiation. Will have him f/up with hand in 5-7 days for ongoing evaluation of this finding, no emergent MRI at this time. Discussed case with my attending Dr. Rogene Houston who agrees with plan.  Will rx naprosyn, advised use of tylenol. Will provide wrist splint for comfort/protection. Advised RICE. F/up with hand specialist in 5-7 days for ongoing eval and for recheck of symptoms, and with his PCP in 1wk for recheck of symptoms. [facility] reviewed prior to dispensing controlled substance medications, and was notable for: suboxone 105m-2mg SL films #42 films dispensed on 05/24/16, 04/26/16, 03/29/16, and basically monthly going back at least a year; always from [place]. [place](who is his PCP). Given this information, will not be giving narcotic rx. Advised that he can f/up with his PCP for any narcotic rx's. I explained the diagnosis and have given explicit precautions to return to the ER including for any other new or worsening symptoms. The patient understands and accepts the medical plan as it's been dictated and I have answered their questions. Discharge instructions concerning home care and prescriptions have been given. The patient is STABLE and is discharged to home in good condition.   I personally performed the services described in this documentation, which was scribed in my presence. The recorded information has been reviewed and is accurate.   Final Clinical Impressions(s) / ED Diagnoses  Final diagnoses:  Left forearm pain  Pain and swelling of left forearm  Abnormal finding on radiology exam    New Prescriptions New Prescriptions   NAPROXEN (NAPROSYN) 500 MG TABLET    Take 1 tablet (500 mg total) by mouth 2 (two) times daily as needed for mild pain, moderate pain or headache (TAKE WITH MEALS.).     [address] PA-C 06/15/16 [place], MD 06/15/16 [phone]

## 2016-06-15 NOTE — ED Notes (Signed)
Ortho Tech paged for splint application.

## 2016-06-15 NOTE — Discharge Instructions (Signed)
Wear wrist brace as needed for comfort. Ice and elevate wrist throughout the day, using ice pack for no more than 20 minutes every hour.  Alternate between naprosyn and tylenol for pain relief. Call hand specialist follow up today or tomorrow to schedule followup appointment for recheck of ongoing wrist pain in 5-7 days, and for ongoing evaluation of the abnormality seen on your xray today. Follow up with your primary care doctor in 1-2 weeks for recheck of symptoms as well. Return to the ER for changes or worsening symptoms.

## 2016-06-23 DIAGNOSIS — M8568 Other cyst of bone, other site: Secondary | ICD-10-CM | POA: Diagnosis not present

## 2016-06-23 DIAGNOSIS — M25532 Pain in left wrist: Secondary | ICD-10-CM | POA: Diagnosis not present

## 2016-08-01 DIAGNOSIS — M85632 Other cyst of bone, left forearm: Secondary | ICD-10-CM | POA: Diagnosis not present

## 2016-08-17 DIAGNOSIS — N529 Male erectile dysfunction, unspecified: Secondary | ICD-10-CM | POA: Diagnosis not present

## 2016-08-17 DIAGNOSIS — K219 Gastro-esophageal reflux disease without esophagitis: Secondary | ICD-10-CM | POA: Diagnosis not present

## 2016-08-17 DIAGNOSIS — M159 Polyosteoarthritis, unspecified: Secondary | ICD-10-CM | POA: Diagnosis not present

## 2016-08-17 DIAGNOSIS — I10 Essential (primary) hypertension: Secondary | ICD-10-CM | POA: Diagnosis not present

## 2016-08-17 DIAGNOSIS — R600 Localized edema: Secondary | ICD-10-CM | POA: Diagnosis not present

## 2016-08-17 DIAGNOSIS — E291 Testicular hypofunction: Secondary | ICD-10-CM | POA: Diagnosis not present

## 2016-08-17 DIAGNOSIS — Z6829 Body mass index (BMI) 29.0-29.9, adult: Secondary | ICD-10-CM | POA: Diagnosis not present

## 2016-08-17 DIAGNOSIS — E785 Hyperlipidemia, unspecified: Secondary | ICD-10-CM | POA: Diagnosis not present

## 2016-08-17 DIAGNOSIS — F411 Generalized anxiety disorder: Secondary | ICD-10-CM | POA: Diagnosis not present

## 2016-08-29 DIAGNOSIS — J0101 Acute recurrent maxillary sinusitis: Secondary | ICD-10-CM | POA: Diagnosis not present

## 2016-08-29 DIAGNOSIS — N529 Male erectile dysfunction, unspecified: Secondary | ICD-10-CM | POA: Diagnosis not present

## 2016-08-29 DIAGNOSIS — E291 Testicular hypofunction: Secondary | ICD-10-CM | POA: Diagnosis not present

## 2016-08-29 DIAGNOSIS — I10 Essential (primary) hypertension: Secondary | ICD-10-CM | POA: Diagnosis not present

## 2016-08-29 DIAGNOSIS — F411 Generalized anxiety disorder: Secondary | ICD-10-CM | POA: Diagnosis not present

## 2016-08-29 DIAGNOSIS — E876 Hypokalemia: Secondary | ICD-10-CM | POA: Diagnosis not present

## 2016-08-29 DIAGNOSIS — R600 Localized edema: Secondary | ICD-10-CM | POA: Diagnosis not present

## 2016-08-29 DIAGNOSIS — E785 Hyperlipidemia, unspecified: Secondary | ICD-10-CM | POA: Diagnosis not present

## 2016-08-29 DIAGNOSIS — M159 Polyosteoarthritis, unspecified: Secondary | ICD-10-CM | POA: Diagnosis not present

## 2016-08-29 DIAGNOSIS — K219 Gastro-esophageal reflux disease without esophagitis: Secondary | ICD-10-CM | POA: Diagnosis not present

## 2016-09-26 DIAGNOSIS — E785 Hyperlipidemia, unspecified: Secondary | ICD-10-CM | POA: Diagnosis not present

## 2016-09-26 DIAGNOSIS — N529 Male erectile dysfunction, unspecified: Secondary | ICD-10-CM | POA: Diagnosis not present

## 2016-09-26 DIAGNOSIS — E876 Hypokalemia: Secondary | ICD-10-CM | POA: Diagnosis not present

## 2016-09-26 DIAGNOSIS — Z720 Tobacco use: Secondary | ICD-10-CM | POA: Diagnosis not present

## 2016-09-26 DIAGNOSIS — F411 Generalized anxiety disorder: Secondary | ICD-10-CM | POA: Diagnosis not present

## 2016-09-26 DIAGNOSIS — M159 Polyosteoarthritis, unspecified: Secondary | ICD-10-CM | POA: Diagnosis not present

## 2016-09-26 DIAGNOSIS — K219 Gastro-esophageal reflux disease without esophagitis: Secondary | ICD-10-CM | POA: Diagnosis not present

## 2016-09-26 DIAGNOSIS — R600 Localized edema: Secondary | ICD-10-CM | POA: Diagnosis not present

## 2016-09-26 DIAGNOSIS — I10 Essential (primary) hypertension: Secondary | ICD-10-CM | POA: Diagnosis not present

## 2016-09-26 DIAGNOSIS — Z6830 Body mass index (BMI) 30.0-30.9, adult: Secondary | ICD-10-CM | POA: Diagnosis not present

## 2016-09-26 DIAGNOSIS — E669 Obesity, unspecified: Secondary | ICD-10-CM | POA: Diagnosis not present

## 2016-09-26 DIAGNOSIS — E291 Testicular hypofunction: Secondary | ICD-10-CM | POA: Diagnosis not present

## 2017-03-27 DIAGNOSIS — H5789 Other specified disorders of eye and adnexa: Secondary | ICD-10-CM | POA: Diagnosis not present

## 2017-04-15 ENCOUNTER — Emergency Department (HOSPITAL_COMMUNITY)
Admission: EM | Admit: 2017-04-15 | Discharge: 2017-04-15 | Disposition: A | Discharge status: Home / Self Care | Payer: Medicare Other | Attending: Emergency Medicine | Admitting: Emergency Medicine

## 2017-04-15 ENCOUNTER — Emergency Department (HOSPITAL_COMMUNITY): Payer: Medicare Other

## 2017-04-15 ENCOUNTER — Encounter (HOSPITAL_COMMUNITY): Payer: Self-pay | Admitting: Emergency Medicine

## 2017-04-15 DIAGNOSIS — S29012A Strain of muscle and tendon of back wall of thorax, initial encounter: Secondary | ICD-10-CM | POA: Diagnosis not present

## 2017-04-15 DIAGNOSIS — Y939 Activity, unspecified: Secondary | ICD-10-CM | POA: Insufficient documentation

## 2017-04-15 DIAGNOSIS — R0981 Nasal congestion: Secondary | ICD-10-CM

## 2017-04-15 DIAGNOSIS — T148XXA Other injury of unspecified body region, initial encounter: Secondary | ICD-10-CM

## 2017-04-15 DIAGNOSIS — I10 Essential (primary) hypertension: Secondary | ICD-10-CM | POA: Diagnosis not present

## 2017-04-15 DIAGNOSIS — Y929 Unspecified place or not applicable: Secondary | ICD-10-CM | POA: Insufficient documentation

## 2017-04-15 DIAGNOSIS — S46911A Strain of unspecified muscle, fascia and tendon at shoulder and upper arm level, right arm, initial encounter: Secondary | ICD-10-CM | POA: Diagnosis not present

## 2017-04-15 DIAGNOSIS — R05 Cough: Secondary | ICD-10-CM | POA: Diagnosis not present

## 2017-04-15 DIAGNOSIS — Y999 Unspecified external cause status: Secondary | ICD-10-CM | POA: Diagnosis not present

## 2017-04-15 DIAGNOSIS — J40 Bronchitis, not specified as acute or chronic: Secondary | ICD-10-CM | POA: Diagnosis not present

## 2017-04-15 DIAGNOSIS — J209 Acute bronchitis, unspecified: Secondary | ICD-10-CM | POA: Insufficient documentation

## 2017-04-15 DIAGNOSIS — F17210 Nicotine dependence, cigarettes, uncomplicated: Secondary | ICD-10-CM | POA: Insufficient documentation

## 2017-04-15 DIAGNOSIS — Z79899 Other long term (current) drug therapy: Secondary | ICD-10-CM | POA: Insufficient documentation

## 2017-04-15 DIAGNOSIS — X58XXXA Exposure to other specified factors, initial encounter: Secondary | ICD-10-CM | POA: Insufficient documentation

## 2017-04-15 DIAGNOSIS — S46912A Strain of unspecified muscle, fascia and tendon at shoulder and upper arm level, left arm, initial encounter: Secondary | ICD-10-CM | POA: Diagnosis not present

## 2017-04-15 DIAGNOSIS — R079 Chest pain, unspecified: Secondary | ICD-10-CM | POA: Diagnosis not present

## 2017-04-15 MED ORDER — ALBUTEROL SULFATE HFA 108 (90 BASE) MCG/ACT IN AERS
2.0000 | INHALATION_SPRAY | Freq: Once | RESPIRATORY_TRACT | Status: AC
  Administered 2017-04-15: 2 via RESPIRATORY_TRACT
  Filled 2017-04-15: qty 6.7

## 2017-04-15 NOTE — Discharge Instructions (Addendum)
You may take over-the-counter medicine for symptomatic relief, such as Tylenol, Motrin, TheraFlu, Alka seltzer , black elderberry, etc. Please limit acetaminophen (Tylenol) to 4000 mg and Ibuprofen (Motrin, Advil, etc.) to 2400 mg for a 24hr period. Please note that other over-the-counter medicine may contain acetaminophen or ibuprofen as a component of their ingredients.   You may use over-the-counter Motrin (Ibuprofen), Acetaminophen (Tylenol), topical muscle creams such as SalonPas, [employer], Bengay, etc. Please stretch, apply heat, and have massage therapy for additional assistance.

## 2017-04-15 NOTE — ED Notes (Signed)
Bed: WA04 Expected date:  Expected time:  Means of arrival:  Comments: 

## 2017-04-15 NOTE — ED Triage Notes (Signed)
Patient here from home with complaints of chest congestion x3 days. Generalized body aches increased bilateral arms. "I just dont feel like myself". Denies chest pain.

## 2017-04-15 NOTE — ED Provider Notes (Signed)
[name] Provider Note  CSN: [id number] Arrival date & time: 04/15/17 0706  Chief Complaint(s) Nasal Congestion and Generalized Body Aches  HPI [name] is a 62 y.o. male   The history is provided by the patient.  Cough  This is a new problem. Episode onset: 3-4 days. The problem occurs every few minutes. The problem has not changed since onset.The cough is productive of sputum. There has been no fever. Associated symptoms include rhinorrhea. Pertinent negatives include no chest pain, no chills and no shortness of breath. He has tried decongestants for the symptoms. The treatment provided mild relief. He is a smoker. His past medical history is significant for bronchitis.   Patient also endorses bilateral upper back, and shoulder pain worse with coughing.  This began after several severe coughing spells.  Pain is nonexertional, nonpleuritic.  Past Medical History Past Medical History:  Diagnosis Date  . Arthritis   . Bradycardia   . Hypertension    There are no active problems to display for this patient.  Home Medication(s) Prior to Admission medications   Medication Sig Start Date End Date Taking? Authorizing Provider  amLODipine (NORVASC) 10 MG tablet Take 10 mg by mouth every morning.    Yes [provider]  atorvastatin (LIPITOR) 10 MG tablet Take 10 mg by mouth every morning.   Yes [provider]  SUBOXONE 8-2 MG FILM PLACE 1 1/2 FILMS UNDER TONGUE DAILY 03/28/17  Yes [provider]  HYDROcodone-acetaminophen (NORCO/VICODIN) 5-325 MG tablet Take 2 tablets by mouth every 4 (four) hours as needed. Patient not taking: Reported on 04/15/2017 01/26/15   Tanna Furry, MD  methocarbamol (ROBAXIN) 500 MG tablet Take 1 tablet (500 mg total) by mouth 2 (two) times daily. Patient not taking: Reported on 04/15/2017 01/26/15   Tanna Furry, MD  naproxen (NAPROSYN) 500 MG tablet Take 1 tablet (500 mg total) by mouth 2 (two)  times daily. Patient not taking: Reported on 04/15/2017 01/26/15   Tanna Furry, MD  naproxen (NAPROSYN) 500 MG tablet Take 1 tablet (500 mg total) by mouth 2 (two) times daily as needed for mild pain, moderate pain or headache (TAKE WITH MEALS.). Patient not taking: Reported on 04/15/2017 06/15/16   Street, [place], PA-C  pantoprazole (PROTONIX) 40 MG tablet Take 1 tablet (40 mg total) by mouth daily. Patient not taking: Reported on 01/26/2015 10/08/13   Lajean Saver, MD  penicillin v potassium (VEETID) 500 MG tablet Take 1 tablet (500 mg total) by mouth 4 (four) times daily. Patient not taking: Reported on 01/26/2015 08/10/14   Larene Pickett, PA-C                                                                                                                                    Past Surgical History Past Surgical History:  Procedure Laterality Date  . HERNIA REPAIR     Family History Family History  Problem Relation Age of Onset  . Cancer Mother   . Hypertension Mother   . Diabetes Mother   . Cancer Father     Social History Social History   Tobacco Use  . Smoking status: Current Every Day Smoker    Packs/day: 0.50    Years: 25.00    Pack years: 12.50    Types: Cigarettes  . Smokeless tobacco: Never Used  Substance Use Topics  . Alcohol use: No  . Drug use: No   Allergies Sulfa antibiotics  Review of Systems Review of Systems  Constitutional: Negative for chills.  HENT: Positive for rhinorrhea.   Respiratory: Positive for cough. Negative for shortness of breath.   Cardiovascular: Negative for chest pain.   All other systems are reviewed and are negative for acute change except as noted in the HPI  Physical Exam Vital Signs  I have reviewed the triage vital signs BP (!) 153/85 (BP Location: Left Arm)   Pulse (!) 49   Temp 98.6 F (37 C) (Oral)   Resp 15   Ht 5\' 11"  (1.803 m)   Wt 97.5 kg (215 lb)   SpO2 94% Comment: Pt is current everyday smoker  BMI 29.99 kg/m     Physical Exam  Constitutional: He is oriented to person, place, and time. He appears well-developed and well-nourished. No distress.  HENT:  Head: Normocephalic and atraumatic.  Right Ear: External ear normal.  Left Ear: External ear normal.  Nose: Nose normal.  Mouth/Throat: Mucous membranes are normal. No trismus in the jaw.  Eyes: Conjunctivae and EOM are normal. No scleral icterus.  Neck: Normal range of motion and phonation normal.  Cardiovascular: Normal rate and regular rhythm.  Pulmonary/Chest: Effort normal. No stridor. No respiratory distress. He has rhonchi (diffuse; clears with coughing).  Abdominal: He exhibits no distension.  Musculoskeletal: Normal range of motion. He exhibits no edema.       Right shoulder: He exhibits pain. He exhibits no tenderness and no bony tenderness.       Left shoulder: He exhibits pain. He exhibits no tenderness and no bony tenderness.       Cervical back: He exhibits pain. He exhibits no tenderness and no bony tenderness.  Palpation of the bilateral upper back musculature and anterior shoulder relieves the patient's pain.  Neurological: He is alert and oriented to person, place, and time.  Skin: He is not diaphoretic.  Psychiatric: He has a normal mood and affect. His behavior is normal.  Vitals reviewed.   ED Results and Treatments Labs (all labs ordered are listed, but only abnormal results are displayed) Labs Reviewed - No data to display                                                                                                                       EKG  EKG Interpretation  Date/Time:    Ventricular Rate:    PR Interval:    QRS Duration:   QT Interval:  QTC Calculation:   R Axis:     Text Interpretation:        Radiology Dg Chest 2 View  Result Date: 04/15/2017 CLINICAL DATA:  Chest pain, body aches, cough EXAM: CHEST  2 VIEW COMPARISON:  09/30/2013 FINDINGS: Lungs are clear.  No pleural effusion or pneumothorax.  Mild elevation of the right hemidiaphragm, chronic. The heart is normal in size. Visualized osseous structures are within normal limits. IMPRESSION: No evidence of acute cardiopulmonary disease. Electronically Signed   By: Julian Hy M.D.   On: 04/15/2017 07:45   Pertinent labs & imaging results that were available during my care of the patient were reviewed by me and considered in my medical decision making (see chart for details).  Medications Ordered in ED Medications  albuterol (PROVENTIL HFA;VENTOLIN HFA) 108 (90 Base) MCG/ACT inhaler 2 puff (2 puffs Inhalation Given 04/15/17 1002)                                                                                                                                    Procedures Procedures  (including critical care time)  Medical Decision Making / ED Course I have reviewed the nursing notes for this encounter and the patient's prior records (if available in EHR or on provided paperwork).     62 y.o. male presents with cough, and nasal congestion for 3 days. adequate oral hydration. Rest of history as above.  Patient appears well. No signs of toxicity, patient is interactive and playful. No hypoxia, tachypnea or other signs of respiratory distress. No sign of clinical dehydration. Lung exam with rhonchi that clears with coughing. Rest of exam as above.  Chest x-ray without evidence suggestive of pneumonia, pneumothorax, pneumomediastinum.  No abnormal contour of the mediastinum to suggest dissection. No evidence of acute injuries.  Most consistent with viral respiratory infection.   No evidence suggestive of pharyngitis, AOM, PNA.  Patient was provided with albuterol and incentive spirometer to assist with mucus clearing.  Shoulder pain is due to muscle cramping from coughing. Doubt cardiac etiology, PE, or dissection.  Counseled patient for approximately 6 minutes regarding smoking cessation. Discussed risks of smoking and how they  applied and affected their visit here today. Patient not ready to quit at this time, however will follow up with their primary doctor when they are.   CPT code: [phone]: intermediate counseling for smoking cessation   Discussed symptomatic treatment with the patient and they will follow closely with their PCP.   Final Clinical Impression(s) / ED Diagnoses Final diagnoses:  Nasal congestion  Bronchitis  Muscle strain    Disposition: Discharge  Condition: Good  I have discussed the results, Dx and Tx plan with the patient who expressed understanding and agree(s) with the plan. Discharge instructions discussed at great length. The patient was given strict return precautions who verbalized understanding of the instructions. No further questions at time of discharge.    ED  Discharge Orders    None       Follow Up: [facility] [place] [address] [phone]  Schedule an appointment as soon as possible for a visit  in 7-10 days, If symptoms do not improve or  worsen.     This chart was dictated using voice recognition software.  Despite best efforts to proofread,  errors can occur which can change the documentation meaning.   Fatima Blank, MD 04/15/17 1038

## 2017-04-15 NOTE — ED Notes (Signed)
ED Provider at bedside. 

## 2017-08-18 DIAGNOSIS — Z1331 Encounter for screening for depression: Secondary | ICD-10-CM | POA: Diagnosis not present

## 2017-08-18 DIAGNOSIS — Z9181 History of falling: Secondary | ICD-10-CM | POA: Diagnosis not present

## 2017-08-18 DIAGNOSIS — E291 Testicular hypofunction: Secondary | ICD-10-CM | POA: Diagnosis not present

## 2017-08-18 DIAGNOSIS — E785 Hyperlipidemia, unspecified: Secondary | ICD-10-CM | POA: Diagnosis not present

## 2017-08-18 DIAGNOSIS — E876 Hypokalemia: Secondary | ICD-10-CM | POA: Diagnosis not present

## 2017-08-18 DIAGNOSIS — I10 Essential (primary) hypertension: Secondary | ICD-10-CM | POA: Diagnosis not present

## 2017-08-20 DIAGNOSIS — K122 Cellulitis and abscess of mouth: Secondary | ICD-10-CM | POA: Diagnosis not present

## 2017-08-20 DIAGNOSIS — K069 Disorder of gingiva and edentulous alveolar ridge, unspecified: Secondary | ICD-10-CM | POA: Diagnosis not present

## 2017-08-20 DIAGNOSIS — K056 Periodontal disease, unspecified: Secondary | ICD-10-CM | POA: Diagnosis not present

## 2017-08-28 DIAGNOSIS — Z1331 Encounter for screening for depression: Secondary | ICD-10-CM | POA: Diagnosis not present

## 2017-08-28 DIAGNOSIS — E785 Hyperlipidemia, unspecified: Secondary | ICD-10-CM | POA: Diagnosis not present

## 2017-08-28 DIAGNOSIS — I10 Essential (primary) hypertension: Secondary | ICD-10-CM | POA: Diagnosis not present

## 2017-08-28 DIAGNOSIS — E291 Testicular hypofunction: Secondary | ICD-10-CM | POA: Diagnosis not present

## 2017-08-28 DIAGNOSIS — Z9181 History of falling: Secondary | ICD-10-CM | POA: Diagnosis not present

## 2017-08-28 DIAGNOSIS — E876 Hypokalemia: Secondary | ICD-10-CM | POA: Diagnosis not present

## 2017-09-18 DIAGNOSIS — E785 Hyperlipidemia, unspecified: Secondary | ICD-10-CM | POA: Diagnosis not present

## 2017-09-18 DIAGNOSIS — E876 Hypokalemia: Secondary | ICD-10-CM | POA: Diagnosis not present

## 2017-09-18 DIAGNOSIS — I10 Essential (primary) hypertension: Secondary | ICD-10-CM | POA: Diagnosis not present

## 2017-09-18 DIAGNOSIS — Z87891 Personal history of nicotine dependence: Secondary | ICD-10-CM | POA: Diagnosis not present

## 2017-09-18 DIAGNOSIS — Z6828 Body mass index (BMI) 28.0-28.9, adult: Secondary | ICD-10-CM | POA: Diagnosis not present

## 2017-09-18 DIAGNOSIS — E291 Testicular hypofunction: Secondary | ICD-10-CM | POA: Diagnosis not present

## 2017-10-03 DIAGNOSIS — Z87891 Personal history of nicotine dependence: Secondary | ICD-10-CM | POA: Diagnosis not present

## 2017-10-03 DIAGNOSIS — E291 Testicular hypofunction: Secondary | ICD-10-CM | POA: Diagnosis not present

## 2017-10-03 DIAGNOSIS — E876 Hypokalemia: Secondary | ICD-10-CM | POA: Diagnosis not present

## 2017-10-03 DIAGNOSIS — M159 Polyosteoarthritis, unspecified: Secondary | ICD-10-CM | POA: Diagnosis not present

## 2017-11-06 ENCOUNTER — Other Ambulatory Visit: Payer: Self-pay

## 2017-11-06 ENCOUNTER — Emergency Department (HOSPITAL_COMMUNITY)
Admission: EM | Admit: 2017-11-06 | Discharge: 2017-11-06 | Disposition: A | Discharge status: Home / Self Care | Payer: Medicare Other | Attending: Emergency Medicine | Admitting: Emergency Medicine

## 2017-11-06 ENCOUNTER — Encounter (HOSPITAL_COMMUNITY): Payer: Self-pay

## 2017-11-06 DIAGNOSIS — Z79899 Other long term (current) drug therapy: Secondary | ICD-10-CM | POA: Diagnosis not present

## 2017-11-06 DIAGNOSIS — M79605 Pain in left leg: Secondary | ICD-10-CM | POA: Insufficient documentation

## 2017-11-06 DIAGNOSIS — F17210 Nicotine dependence, cigarettes, uncomplicated: Secondary | ICD-10-CM | POA: Insufficient documentation

## 2017-11-06 DIAGNOSIS — M549 Dorsalgia, unspecified: Secondary | ICD-10-CM | POA: Diagnosis not present

## 2017-11-06 DIAGNOSIS — I10 Essential (primary) hypertension: Secondary | ICD-10-CM | POA: Diagnosis not present

## 2017-11-06 DIAGNOSIS — M545 Low back pain: Secondary | ICD-10-CM | POA: Diagnosis not present

## 2017-11-06 MED ORDER — TRAMADOL HCL 50 MG PO TABS: 50.0000 mg | ORAL_TABLET | Freq: Four times a day (QID) | ORAL | 0 refills | Status: DC | PRN
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
## 2017-11-08 ENCOUNTER — Encounter (HOSPITAL_COMMUNITY): Payer: Self-pay

## 2017-11-08 ENCOUNTER — Ambulatory Visit (HOSPITAL_COMMUNITY)
Admission: EM | Admit: 2017-11-08 | Discharge: 2017-11-08 | Disposition: A | Discharge status: Home / Self Care | Payer: Medicare Other | Attending: Family Medicine | Admitting: Family Medicine

## 2017-11-08 ENCOUNTER — Other Ambulatory Visit: Payer: Self-pay

## 2017-11-08 DIAGNOSIS — S29012A Strain of muscle and tendon of back wall of thorax, initial encounter: Secondary | ICD-10-CM | POA: Diagnosis not present

## 2017-11-08 MED ORDER — CYCLOBENZAPRINE HCL 10 MG PO TABS: 10.0000 mg | ORAL_TABLET | Freq: Two times a day (BID) | ORAL | 0 refills | Status: DC | PRN

## 2017-11-08 MED ORDER — IBUPROFEN 800 MG PO TABS: 800.0000 mg | ORAL_TABLET | Freq: Three times a day (TID) | ORAL | 0 refills | Status: DC
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
## 2017-11-08 NOTE — Discharge Instructions (Signed)
Use anti-inflammatories for pain/swelling. You may take up to 800 mg Ibuprofen every 8 hours with food. You may supplement Ibuprofen with Tylenol [phone] mg every 8 hours.   You may use flexeril as needed to help with pain. This is a muscle relaxer and causes sedation- please use only at bedtime or when you will be home and not have to drive/work  May use ice and heating pad to help with swelling and back as well as to help with muscles  Please go about daily activities, but avoid heavy lifting or excessive exercise.  Please return if developing worsening pain, shortness of breath, chest pain, difficulty breathing, weakness in legs, changes in bowels or bladder habits, numbness between your thighs.

## 2017-11-20 DIAGNOSIS — I10 Essential (primary) hypertension: Secondary | ICD-10-CM | POA: Diagnosis not present

## 2017-11-20 DIAGNOSIS — E876 Hypokalemia: Secondary | ICD-10-CM | POA: Diagnosis not present

## 2017-11-20 DIAGNOSIS — Z87891 Personal history of nicotine dependence: Secondary | ICD-10-CM | POA: Diagnosis not present

## 2017-11-20 DIAGNOSIS — E291 Testicular hypofunction: Secondary | ICD-10-CM | POA: Diagnosis not present

## 2017-11-20 DIAGNOSIS — M159 Polyosteoarthritis, unspecified: Secondary | ICD-10-CM | POA: Diagnosis not present

## 2017-11-27 ENCOUNTER — Encounter (HOSPITAL_COMMUNITY): Payer: Self-pay

## 2017-11-27 ENCOUNTER — Ambulatory Visit (HOSPITAL_COMMUNITY)
Admission: EM | Admit: 2017-11-27 | Discharge: 2017-11-27 | Disposition: A | Discharge status: Home / Self Care | Payer: Medicare Other | Attending: Family Medicine | Admitting: Family Medicine

## 2017-11-27 DIAGNOSIS — M62830 Muscle spasm of back: Secondary | ICD-10-CM | POA: Diagnosis not present
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
## 2018-01-11 DIAGNOSIS — E291 Testicular hypofunction: Secondary | ICD-10-CM | POA: Diagnosis not present

## 2018-01-11 DIAGNOSIS — I10 Essential (primary) hypertension: Secondary | ICD-10-CM | POA: Diagnosis not present

## 2018-01-11 DIAGNOSIS — M159 Polyosteoarthritis, unspecified: Secondary | ICD-10-CM | POA: Diagnosis not present

## 2018-01-11 DIAGNOSIS — E876 Hypokalemia: Secondary | ICD-10-CM | POA: Diagnosis not present

## 2018-01-11 DIAGNOSIS — Z87891 Personal history of nicotine dependence: Secondary | ICD-10-CM | POA: Diagnosis not present

## 2018-01-19 DIAGNOSIS — Z87891 Personal history of nicotine dependence: Secondary | ICD-10-CM | POA: Diagnosis not present

## 2018-01-19 DIAGNOSIS — M159 Polyosteoarthritis, unspecified: Secondary | ICD-10-CM | POA: Diagnosis not present

## 2018-01-19 DIAGNOSIS — Z1339 Encounter for screening examination for other mental health and behavioral disorders: Secondary | ICD-10-CM | POA: Diagnosis not present

## 2018-01-19 DIAGNOSIS — I10 Essential (primary) hypertension: Secondary | ICD-10-CM | POA: Diagnosis not present

## 2018-01-19 DIAGNOSIS — E876 Hypokalemia: Secondary | ICD-10-CM | POA: Diagnosis not present

## 2018-01-19 DIAGNOSIS — E291 Testicular hypofunction: Secondary | ICD-10-CM | POA: Diagnosis not present

## 2018-01-28 ENCOUNTER — Encounter (HOSPITAL_COMMUNITY): Payer: Self-pay | Admitting: Family Medicine

## 2018-01-28 ENCOUNTER — Ambulatory Visit (HOSPITAL_COMMUNITY)
Admission: EM | Admit: 2018-01-28 | Discharge: 2018-01-28 | Disposition: A | Discharge status: Home / Self Care | Payer: Medicare Other | Attending: Family Medicine | Admitting: Family Medicine

## 2018-01-28 ENCOUNTER — Other Ambulatory Visit: Payer: Self-pay

## 2018-01-28 DIAGNOSIS — K029 Dental caries, unspecified: Secondary | ICD-10-CM | POA: Diagnosis not present

## 2018-01-28 DIAGNOSIS — K047 Periapical abscess without sinus: Secondary | ICD-10-CM

## 2018-01-28 MED ORDER — PENICILLIN V POTASSIUM 500 MG PO TABS: 500.0000 mg | ORAL_TABLET | Freq: Three times a day (TID) | ORAL | 0 refills | Status: DC

## 2018-01-28 NOTE — Discharge Instructions (Addendum)
You'll need a dentist to solve this chronic problem.  In the meantime, rinse your mouth after meals and brush regularly.

## 2018-01-28 NOTE — ED Provider Notes (Signed)
[place]    CSN: [id number] Arrival date & time: 01/28/18  1241     History   Chief Complaint Chief Complaint  Patient presents with  . Dental Pain    HPI [name] is a 62 y.o. male.   Unemployed 62 year old man comes in complaining of intermittent pain in tooth #2 and tooth #24.  He has poor dental hygiene.  The pain is been present intermittently for 2 days.     Past Medical History:  Diagnosis Date  . Arthritis   . Bradycardia   . Hypertension     There are no active problems to display for this patient.   Past Surgical History:  Procedure Laterality Date  . HERNIA REPAIR         Home Medications    Prior to Admission medications   Medication Sig Start Date End Date Taking? Authorizing Provider  amLODipine (NORVASC) 10 MG tablet Take 10 mg by mouth every morning.    Yes [provider]  atorvastatin (LIPITOR) 10 MG tablet Take 10 mg by mouth every morning.   Yes [provider]  SUBOXONE 8-2 MG FILM PLACE 1 1/2 FILMS UNDER TONGUE DAILY 03/28/17  Yes [provider]  penicillin v potassium (VEETID) 500 MG tablet Take 1 tablet (500 mg total) by mouth 3 (three) times daily. 01/28/18   Robyn Haber, MD  traMADol (ULTRAM) 50 MG tablet Take 1 tablet (50 mg total) by mouth every 6 (six) hours as needed. 11/06/17   Carmin Muskrat, MD    Family History Family History  Problem Relation Age of Onset  . Cancer Mother   . Hypertension Mother   . Diabetes Mother   . Cancer Father     Social History Social History   Tobacco Use  . Smoking status: Current Every Day Smoker    Packs/day: 0.50    Years: 25.00    Pack years: 12.50    Types: Cigarettes  . Smokeless tobacco: Never Used  Substance Use Topics  . Alcohol use: No  . Drug use: No     Allergies   Sulfa antibiotics   Review of Systems Review of Systems  HENT: Positive for dental problem.      Physical Exam Triage Vital Signs ED Triage  Vitals  Enc Vitals Group     BP 01/28/18 1302 (!) 156/88     Pulse Rate 01/28/18 1302 (!) 49     Resp --      Temp 01/28/18 1302 98.4 F (36.9 C)     Temp Source 01/28/18 1302 Oral     SpO2 01/28/18 1302 96 %     Weight --      Height --      Head Circumference --      Peak Flow --      Pain Score 01/28/18 1300 6     Pain Loc --      Pain Edu? --      Excl. in [place]? --    No data found.  Updated Vital Signs BP (!) 156/88 (BP Location: Right Arm)   Pulse (!) 49   Temp 98.4 F (36.9 C) (Oral)   SpO2 96%    Physical Exam  Constitutional: He appears well-developed and well-nourished.  HENT:  Right Ear: External ear normal.  Left Ear: External ear normal.  Receding gums diffusely with pyuria, plaque buildup, and multiple caries.  Eyes: Conjunctivae are normal.  Neck: Normal range of motion.  Pulmonary/Chest: Effort  normal.  Musculoskeletal: Normal range of motion.  Lymphadenopathy:    He has no cervical adenopathy.  Neurological: He is alert.  Skin: Skin is warm and dry.  Nursing note and vitals reviewed.    UC Treatments / Results  Labs (all labs ordered are listed, but only abnormal results are displayed) Labs Reviewed - No data to display  EKG None  Radiology No results found.  Procedures Procedures (including critical care time)  Medications Ordered in UC Medications - No data to display  Initial Impression / Assessment and Plan / UC Course  I have reviewed the triage vital signs and the nursing notes.  Pertinent labs & imaging results that were available during my care of the patient were reviewed by me and considered in my medical decision making (see chart for details).    Final Clinical Impressions(s) / UC Diagnoses   Final diagnoses:  Dental caries  Dental infection     Discharge Instructions     You'll need a dentist to solve this chronic problem.  In the meantime, rinse your mouth after meals and brush regularly.    ED  Prescriptions    Medication Sig Dispense Auth. Provider   penicillin v potassium (VEETID) 500 MG tablet Take 1 tablet (500 mg total) by mouth 3 (three) times daily. 30 tablet Robyn Haber, MD     Controlled Substance Prescriptions [place] Controlled Substance Registry consulted? Not Applicable   Robyn Haber, MD 01/28/18 1306

## 2018-01-28 NOTE — ED Triage Notes (Signed)
Pt reports right, upper, posterior and left, lower, anterior dental pain intermittently x1 week.

## 2018-02-04 DIAGNOSIS — K047 Periapical abscess without sinus: Secondary | ICD-10-CM | POA: Insufficient documentation

## 2018-02-04 DIAGNOSIS — Z79899 Other long term (current) drug therapy: Secondary | ICD-10-CM | POA: Insufficient documentation

## 2018-02-04 DIAGNOSIS — F17210 Nicotine dependence, cigarettes, uncomplicated: Secondary | ICD-10-CM | POA: Diagnosis not present

## 2018-02-04 DIAGNOSIS — K0889 Other specified disorders of teeth and supporting structures: Secondary | ICD-10-CM | POA: Diagnosis present

## 2018-02-04 DIAGNOSIS — I10 Essential (primary) hypertension: Secondary | ICD-10-CM | POA: Diagnosis not present

## 2018-02-04 NOTE — ED Triage Notes (Signed)
Pt arriving with dental pain on left lower side. Pt reports he was given an antibiotic and has been taking it since Thursday but has seen no improvement.

## 2018-02-05 ENCOUNTER — Emergency Department (HOSPITAL_COMMUNITY)
Admission: EM | Admit: 2018-02-05 | Discharge: 2018-02-05 | Disposition: A | Discharge status: Home / Self Care | Payer: Medicare Other | Attending: Emergency Medicine | Admitting: Emergency Medicine

## 2018-02-05 ENCOUNTER — Encounter (HOSPITAL_COMMUNITY): Payer: Self-pay | Admitting: Emergency Medicine

## 2018-02-05 ENCOUNTER — Other Ambulatory Visit: Payer: Self-pay

## 2018-02-05 DIAGNOSIS — K047 Periapical abscess without sinus: Secondary | ICD-10-CM

## 2018-02-05 MED ORDER — AMOXICILLIN-POT CLAVULANATE 875-125 MG PO TABS: 1.0000 | ORAL_TABLET | Freq: Two times a day (BID) | ORAL | 0 refills | Status: DC

## 2018-02-05 NOTE — Discharge Instructions (Signed)
Follow-up with a dentist.  Only a dentist can fix your problem.  We recommend that you take Augmentin as prescribed to treat likely underlying infection.  Discontinue Penicillin as previously prescribed. Take ibuprofen as prescribed for pain control.  Return to the ED for any new or concerning symptoms.

## 2018-02-05 NOTE — ED Provider Notes (Signed)
[facility] DEPT Provider Note   CSN: [id number] Arrival date & time: 02/04/18  2350     History   Chief Complaint Chief Complaint  Patient presents with  . Dental Pain    HPI [name] is a 62 y.o. male.   62 year old male presents to the emergency department for evaluation of dentalgia.  He states that he has been experiencing left lower dentalgia over the past 4 days.  Was seen at urgent care previously and given an antibiotic.  He has been taking the antibiotic since Thursday without improvement.  Has begun to notice some worsening facial swelling over the past 1 to 2 days.  Patient managing pain with 800 mg ibuprofen.  He has not had any fevers, no difficulty opening his jaw, difficulty swallowing.  He is not actively followed by a dentist, but was given a list of dental resources at his recent urgent care visit.  The history is provided by the patient. No language interpreter was used.  Dental Pain      Past Medical History:  Diagnosis Date  . Arthritis   . Bradycardia   . Hypertension     There are no active problems to display for this patient.   Past Surgical History:  Procedure Laterality Date  . HERNIA REPAIR          Home Medications    Prior to Admission medications   Medication Sig Start Date End Date Taking? Authorizing Provider  amLODipine (NORVASC) 10 MG tablet Take 10 mg by mouth every morning.     [provider]  amoxicillin-clavulanate (AUGMENTIN) 875-125 MG tablet Take 1 tablet by mouth every 12 (twelve) hours. 02/05/18   Antonietta Breach, PA-C  atorvastatin (LIPITOR) 10 MG tablet Take 10 mg by mouth every morning.    [provider]  penicillin v potassium (VEETID) 500 MG tablet Take 1 tablet (500 mg total) by mouth 3 (three) times daily. 01/28/18   Robyn Haber, MD  SUBOXONE 8-2 MG FILM PLACE 1 1/2 FILMS UNDER TONGUE DAILY 03/28/17   [provider]  traMADol (ULTRAM) 50 MG tablet  Take 1 tablet (50 mg total) by mouth every 6 (six) hours as needed. 11/06/17   Carmin Muskrat, MD    Family History Family History  Problem Relation Age of Onset  . Cancer Mother   . Hypertension Mother   . Diabetes Mother   . Cancer Father     Social History Social History   Tobacco Use  . Smoking status: Current Every Day Smoker    Packs/day: 0.50    Years: 25.00    Pack years: 12.50    Types: Cigarettes  . Smokeless tobacco: Never Used  Substance Use Topics  . Alcohol use: No  . Drug use: No     Allergies   Sulfa antibiotics   Review of Systems Review of Systems Ten systems reviewed and are negative for acute change, except as noted in the HPI.    Physical Exam Updated Vital Signs BP 126/74 (BP Location: Right Arm)   Pulse 84   Temp 97.7 F (36.5 C) (Oral)   Resp 16   Ht 5\' 11"  (1.803 m)   Wt 95.3 kg   SpO2 98%   BMI 29.29 kg/m   Physical Exam  Constitutional: He is oriented to person, place, and time. He appears well-developed and well-nourished. No distress.  Nontoxic appearing and in NAD  HENT:  Head: Normocephalic and atraumatic.  TTP to the lower  gingiva with mild induration extending from the L lower later incisor posteriorly. Associated facial swelling. No drainage or purulence in the mouth. No trismus. Tolerating secretions.  Eyes: Conjunctivae and EOM are normal. No scleral icterus.  Neck: Normal range of motion.  No meningismus  Pulmonary/Chest: Effort normal. No respiratory distress.  Respirations even and unlabored  Musculoskeletal: Normal range of motion.  Neurological: He is alert and oriented to person, place, and time. He exhibits normal muscle tone. Coordination normal.  Skin: Skin is warm and dry. No rash noted. He is not diaphoretic. No erythema. No pallor.  Psychiatric: He has a normal mood and affect. His behavior is normal.  Nursing note and vitals reviewed.    ED Treatments / Results  Labs (all labs ordered are listed,  but only abnormal results are displayed) Labs Reviewed - No data to display  EKG None  Radiology No results found.  Procedures Procedures (including critical care time)  Medications Ordered in ED Medications - No data to display   Initial Impression / Assessment and Plan / ED Course  I have reviewed the triage vital signs and the nursing notes.  Pertinent labs & imaging results that were available during my care of the patient were reviewed by me and considered in my medical decision making (see chart for details).     Patient with toothache. Has been on PCN since Thursday. The patient is afebrile. Exam unconcerning for Ludwig's angina or spread of infection. There is not discrete drainable fluid collection on exam. Will change antibiotic to Augmentin. Urged patient to follow-up with dentist. Return precautions discussed and provided. Patient discharged in stable condition with no unaddressed concerns.   Final Clinical Impressions(s) / ED Diagnoses   Final diagnoses:  Dental abscess    ED Discharge Orders         Ordered    amoxicillin-clavulanate (AUGMENTIN) 875-125 MG tablet  Every 12 hours     02/05/18 0204           Antonietta Breach, PA-C 02/05/18 0235    Fatima Blank, MD 02/05/18 [phone]

## 2018-02-26 DIAGNOSIS — E876 Hypokalemia: Secondary | ICD-10-CM | POA: Diagnosis not present

## 2018-02-26 DIAGNOSIS — I10 Essential (primary) hypertension: Secondary | ICD-10-CM | POA: Diagnosis not present

## 2018-02-26 DIAGNOSIS — M159 Polyosteoarthritis, unspecified: Secondary | ICD-10-CM | POA: Diagnosis not present

## 2018-02-26 DIAGNOSIS — E291 Testicular hypofunction: Secondary | ICD-10-CM | POA: Diagnosis not present

## 2018-02-26 DIAGNOSIS — E785 Hyperlipidemia, unspecified: Secondary | ICD-10-CM | POA: Diagnosis not present

## 2018-03-29 DIAGNOSIS — E876 Hypokalemia: Secondary | ICD-10-CM | POA: Diagnosis not present

## 2018-03-29 DIAGNOSIS — M159 Polyosteoarthritis, unspecified: Secondary | ICD-10-CM | POA: Diagnosis not present

## 2018-03-29 DIAGNOSIS — R600 Localized edema: Secondary | ICD-10-CM | POA: Diagnosis not present

## 2018-03-29 DIAGNOSIS — E291 Testicular hypofunction: Secondary | ICD-10-CM | POA: Diagnosis not present

## 2018-05-01 DIAGNOSIS — E291 Testicular hypofunction: Secondary | ICD-10-CM | POA: Diagnosis not present

## 2018-05-01 DIAGNOSIS — M159 Polyosteoarthritis, unspecified: Secondary | ICD-10-CM | POA: Diagnosis not present

## 2018-05-01 DIAGNOSIS — Z720 Tobacco use: Secondary | ICD-10-CM | POA: Diagnosis not present

## 2018-05-01 DIAGNOSIS — R600 Localized edema: Secondary | ICD-10-CM | POA: Diagnosis not present

## 2018-05-01 DIAGNOSIS — E876 Hypokalemia: Secondary | ICD-10-CM | POA: Diagnosis not present

## 2018-07-05 DIAGNOSIS — Z720 Tobacco use: Secondary | ICD-10-CM | POA: Diagnosis not present

## 2018-07-05 DIAGNOSIS — I10 Essential (primary) hypertension: Secondary | ICD-10-CM | POA: Diagnosis not present

## 2018-07-05 DIAGNOSIS — M159 Polyosteoarthritis, unspecified: Secondary | ICD-10-CM | POA: Diagnosis not present

## 2018-07-05 DIAGNOSIS — E785 Hyperlipidemia, unspecified: Secondary | ICD-10-CM | POA: Diagnosis not present

## 2018-07-05 DIAGNOSIS — K047 Periapical abscess without sinus: Secondary | ICD-10-CM | POA: Diagnosis not present

## 2018-07-05 DIAGNOSIS — E291 Testicular hypofunction: Secondary | ICD-10-CM | POA: Diagnosis not present

## 2018-07-31 DIAGNOSIS — E291 Testicular hypofunction: Secondary | ICD-10-CM | POA: Diagnosis not present

## 2018-07-31 DIAGNOSIS — E876 Hypokalemia: Secondary | ICD-10-CM | POA: Diagnosis not present

## 2018-07-31 DIAGNOSIS — N529 Male erectile dysfunction, unspecified: Secondary | ICD-10-CM | POA: Diagnosis not present

## 2018-07-31 DIAGNOSIS — M159 Polyosteoarthritis, unspecified: Secondary | ICD-10-CM | POA: Diagnosis not present

## 2018-07-31 DIAGNOSIS — Z720 Tobacco use: Secondary | ICD-10-CM | POA: Diagnosis not present

## 2018-08-01 DIAGNOSIS — E785 Hyperlipidemia, unspecified: Secondary | ICD-10-CM | POA: Diagnosis not present

## 2018-08-01 DIAGNOSIS — Z9181 History of falling: Secondary | ICD-10-CM | POA: Diagnosis not present

## 2018-08-01 DIAGNOSIS — Z0000 Encounter for general adult medical examination without abnormal findings: Secondary | ICD-10-CM | POA: Diagnosis not present

## 2018-08-01 DIAGNOSIS — Z125 Encounter for screening for malignant neoplasm of prostate: Secondary | ICD-10-CM | POA: Diagnosis not present

## 2018-08-01 DIAGNOSIS — Z1211 Encounter for screening for malignant neoplasm of colon: Secondary | ICD-10-CM | POA: Diagnosis not present

## 2018-08-01 DIAGNOSIS — Z1331 Encounter for screening for depression: Secondary | ICD-10-CM | POA: Diagnosis not present

## 2018-08-31 DIAGNOSIS — E876 Hypokalemia: Secondary | ICD-10-CM | POA: Diagnosis not present

## 2018-08-31 DIAGNOSIS — M159 Polyosteoarthritis, unspecified: Secondary | ICD-10-CM | POA: Diagnosis not present

## 2018-08-31 DIAGNOSIS — Z720 Tobacco use: Secondary | ICD-10-CM | POA: Diagnosis not present

## 2018-08-31 DIAGNOSIS — N529 Male erectile dysfunction, unspecified: Secondary | ICD-10-CM | POA: Diagnosis not present

## 2018-08-31 DIAGNOSIS — E291 Testicular hypofunction: Secondary | ICD-10-CM | POA: Diagnosis not present

## 2018-10-10 DIAGNOSIS — M159 Polyosteoarthritis, unspecified: Secondary | ICD-10-CM | POA: Diagnosis not present

## 2018-10-10 DIAGNOSIS — E291 Testicular hypofunction: Secondary | ICD-10-CM | POA: Diagnosis not present

## 2018-10-10 DIAGNOSIS — I10 Essential (primary) hypertension: Secondary | ICD-10-CM | POA: Diagnosis not present

## 2018-10-10 DIAGNOSIS — R600 Localized edema: Secondary | ICD-10-CM | POA: Diagnosis not present

## 2018-10-10 DIAGNOSIS — E876 Hypokalemia: Secondary | ICD-10-CM | POA: Diagnosis not present

## 2018-10-10 DIAGNOSIS — Z720 Tobacco use: Secondary | ICD-10-CM | POA: Diagnosis not present

## 2018-10-23 DIAGNOSIS — M159 Polyosteoarthritis, unspecified: Secondary | ICD-10-CM | POA: Diagnosis not present

## 2018-10-23 DIAGNOSIS — E876 Hypokalemia: Secondary | ICD-10-CM | POA: Diagnosis not present

## 2018-10-23 DIAGNOSIS — I10 Essential (primary) hypertension: Secondary | ICD-10-CM | POA: Diagnosis not present

## 2018-10-23 DIAGNOSIS — E291 Testicular hypofunction: Secondary | ICD-10-CM | POA: Diagnosis not present

## 2018-10-23 DIAGNOSIS — E785 Hyperlipidemia, unspecified: Secondary | ICD-10-CM | POA: Diagnosis not present

## 2018-10-23 DIAGNOSIS — Z720 Tobacco use: Secondary | ICD-10-CM | POA: Diagnosis not present

## 2018-10-23 DIAGNOSIS — R600 Localized edema: Secondary | ICD-10-CM | POA: Diagnosis not present

## 2018-11-06 DIAGNOSIS — E876 Hypokalemia: Secondary | ICD-10-CM | POA: Diagnosis not present

## 2018-11-06 DIAGNOSIS — R600 Localized edema: Secondary | ICD-10-CM | POA: Diagnosis not present

## 2018-11-06 DIAGNOSIS — Z720 Tobacco use: Secondary | ICD-10-CM | POA: Diagnosis not present

## 2018-11-06 DIAGNOSIS — E291 Testicular hypofunction: Secondary | ICD-10-CM | POA: Diagnosis not present

## 2018-11-06 DIAGNOSIS — M159 Polyosteoarthritis, unspecified: Secondary | ICD-10-CM | POA: Diagnosis not present

## 2018-12-11 DIAGNOSIS — M159 Polyosteoarthritis, unspecified: Secondary | ICD-10-CM | POA: Diagnosis not present

## 2018-12-11 DIAGNOSIS — Z720 Tobacco use: Secondary | ICD-10-CM | POA: Diagnosis not present

## 2018-12-11 DIAGNOSIS — R600 Localized edema: Secondary | ICD-10-CM | POA: Diagnosis not present

## 2018-12-11 DIAGNOSIS — E876 Hypokalemia: Secondary | ICD-10-CM | POA: Diagnosis not present

## 2018-12-11 DIAGNOSIS — E291 Testicular hypofunction: Secondary | ICD-10-CM | POA: Diagnosis not present

## 2019-01-08 DIAGNOSIS — E291 Testicular hypofunction: Secondary | ICD-10-CM | POA: Diagnosis not present

## 2019-01-08 DIAGNOSIS — M159 Polyosteoarthritis, unspecified: Secondary | ICD-10-CM | POA: Diagnosis not present

## 2019-01-08 DIAGNOSIS — Z720 Tobacco use: Secondary | ICD-10-CM | POA: Diagnosis not present

## 2019-01-08 DIAGNOSIS — E876 Hypokalemia: Secondary | ICD-10-CM | POA: Diagnosis not present

## 2019-01-08 DIAGNOSIS — R600 Localized edema: Secondary | ICD-10-CM | POA: Diagnosis not present

## 2019-02-19 DIAGNOSIS — Z720 Tobacco use: Secondary | ICD-10-CM | POA: Diagnosis not present

## 2019-02-19 DIAGNOSIS — E785 Hyperlipidemia, unspecified: Secondary | ICD-10-CM | POA: Diagnosis not present

## 2019-02-19 DIAGNOSIS — E876 Hypokalemia: Secondary | ICD-10-CM | POA: Diagnosis not present

## 2019-02-19 DIAGNOSIS — I10 Essential (primary) hypertension: Secondary | ICD-10-CM | POA: Diagnosis not present

## 2019-02-19 DIAGNOSIS — M159 Polyosteoarthritis, unspecified: Secondary | ICD-10-CM | POA: Diagnosis not present

## 2019-02-19 DIAGNOSIS — E291 Testicular hypofunction: Secondary | ICD-10-CM | POA: Diagnosis not present

## 2019-02-19 DIAGNOSIS — R600 Localized edema: Secondary | ICD-10-CM | POA: Diagnosis not present

## 2019-04-05 ENCOUNTER — Ambulatory Visit (HOSPITAL_COMMUNITY)
Admission: EM | Admit: 2019-04-05 | Discharge: 2019-04-05 | Disposition: A | Discharge status: Home / Self Care | Payer: Medicare Other | Attending: Family Medicine | Admitting: Family Medicine

## 2019-04-05 ENCOUNTER — Other Ambulatory Visit: Payer: Self-pay

## 2019-04-05 ENCOUNTER — Encounter (HOSPITAL_COMMUNITY): Payer: Self-pay | Admitting: Emergency Medicine

## 2019-04-05 DIAGNOSIS — Z20822 Contact with and (suspected) exposure to covid-19: Secondary | ICD-10-CM | POA: Insufficient documentation

## 2019-04-05 NOTE — ED Triage Notes (Signed)
Pt here requesting covid test; pt sts son was positive but denies any sx currently

## 2019-04-05 NOTE — Discharge Instructions (Addendum)
We will call you if your COVID swab is positive

## 2019-04-05 NOTE — ED Provider Notes (Signed)
[place]    CSN: [id number] Arrival date & time: 04/05/19  1022      History   Chief Complaint Chief Complaint  Patient presents with  . covid testing    HPI [name] is a 64 y.o. male.   64 year old male presents today for Covid testing.  Reporting he was possibly exposed to someone that lives in the same household.  He currently denies any symptoms.     Past Medical History:  Diagnosis Date  . Arthritis   . Bradycardia   . Hypertension     There are no problems to display for this patient.   Past Surgical History:  Procedure Laterality Date  . HERNIA REPAIR         Home Medications    Prior to Admission medications   Medication Sig Start Date End Date Taking? Authorizing Provider  amLODipine (NORVASC) 10 MG tablet Take 10 mg by mouth every morning.     [provider]  atorvastatin (LIPITOR) 10 MG tablet Take 10 mg by mouth every morning.    [provider]  SUBOXONE 8-2 MG FILM PLACE 1 1/2 FILMS UNDER TONGUE DAILY 03/28/17   [provider]  traMADol (ULTRAM) 50 MG tablet Take 1 tablet (50 mg total) by mouth every 6 (six) hours as needed. 11/06/17   Carmin Muskrat, MD    Family History Family History  Problem Relation Age of Onset  . Cancer Mother   . Hypertension Mother   . Diabetes Mother   . Cancer Father     Social History Social History   Tobacco Use  . Smoking status: Current Every Day Smoker    Packs/day: 0.50    Years: 25.00    Pack years: 12.50    Types: Cigarettes  . Smokeless tobacco: Never Used  Substance Use Topics  . Alcohol use: No  . Drug use: No     Allergies   Sulfa antibiotics   Review of Systems Review of Systems  Constitutional: Negative for chills, fatigue and fever.  HENT: Negative for congestion, postnasal drip, rhinorrhea and sinus pain.   Respiratory: Negative for cough.   Gastrointestinal: Negative for diarrhea.  Musculoskeletal: Negative for myalgias.      Physical Exam Triage Vital Signs ED Triage Vitals  Enc Vitals Group     BP 04/05/19 1059 107/66     Pulse Rate 04/05/19 1059 (!) 52     Resp 04/05/19 1059 18     Temp 04/05/19 1059 98.2 F (36.8 C)     Temp Source 04/05/19 1059 Oral     SpO2 04/05/19 1059 99 %     Weight --      Height --      Head Circumference --      Peak Flow --      Pain Score 04/05/19 1100 0     Pain Loc --      Pain Edu? --      Excl. in [place]? --    No data found.  Updated Vital Signs BP 107/66 (BP Location: Right Arm)   Pulse (!) 52   Temp 98.2 F (36.8 C) (Oral)   Resp 18   SpO2 99%   Visual Acuity Right Eye Distance:   Left Eye Distance:   Bilateral Distance:    Right Eye Near:   Left Eye Near:    Bilateral Near:     Physical Exam Vitals and nursing note reviewed.  Constitutional:  Appearance: Normal appearance.  HENT:     Head: Normocephalic and atraumatic.     Nose: Nose normal.  Eyes:     Conjunctiva/sclera: Conjunctivae normal.  Pulmonary:     Effort: Pulmonary effort is normal.  Musculoskeletal:        General: Normal range of motion.     Cervical back: Normal range of motion.  Skin:    General: Skin is warm and dry.  Neurological:     Mental Status: He is alert.  Psychiatric:        Mood and Affect: Mood normal.      UC Treatments / Results  Labs (all labs ordered are listed, but only abnormal results are displayed) Labs Reviewed  NOVEL CORONAVIRUS, NAA (HOSP ORDER, SEND-OUT TO REF LAB; TAT 18-24 HRS)    EKG   Radiology No results found.  Procedures Procedures (including critical care time)  Medications Ordered in UC Medications - No data to display  Initial Impression / Assessment and Plan / UC Course  I have reviewed the triage vital signs and the nursing notes.  Pertinent labs & imaging results that were available during my care of the patient were reviewed by me and considered in my medical decision making (see chart for details).      Exposure to [place] pending.  Precautions given  Final Clinical Impressions(s) / UC Diagnoses   Final diagnoses:  Exposure to COVID-19 virus     Discharge Instructions     We will call you if your COVID swab is positive    ED Prescriptions    None     PDMP not reviewed this encounter.   Orvan July, NP 04/05/19 1124

## 2019-04-07 LAB — NOVEL CORONAVIRUS, NAA (HOSP ORDER, SEND-OUT TO REF LAB; TAT 18-24 HRS): SARS-CoV-2, NAA: NOT DETECTED

## 2019-04-27 ENCOUNTER — Ambulatory Visit (HOSPITAL_COMMUNITY)
Admission: EM | Admit: 2019-04-27 | Discharge: 2019-04-27 | Disposition: A | Discharge status: Home / Self Care | Payer: Medicare Other | Attending: Family Medicine | Admitting: Family Medicine

## 2019-04-27 ENCOUNTER — Encounter (HOSPITAL_COMMUNITY): Payer: Self-pay

## 2019-04-27 ENCOUNTER — Other Ambulatory Visit: Payer: Self-pay

## 2019-04-27 DIAGNOSIS — K0889 Other specified disorders of teeth and supporting structures: Secondary | ICD-10-CM

## 2019-04-27 MED ORDER — CLINDAMYCIN HCL 300 MG PO CAPS: 300.0000 mg | ORAL_CAPSULE | Freq: Three times a day (TID) | ORAL | 0 refills | Status: AC

## 2019-04-27 MED ORDER — NAPROXEN 500 MG PO TABS: 500.0000 mg | ORAL_TABLET | Freq: Two times a day (BID) | ORAL | 0 refills | Status: AC

## 2019-04-27 NOTE — ED Provider Notes (Signed)
[place]    CSN: [id number] Arrival date & time: 04/27/19  1051      History   Chief Complaint Chief Complaint  Patient presents with  . Dental Pain    HPI [name] is a 64 y.o. male.   HPI  Patients present with a complaint of left lower dental pain and gum swelling. Patient has poor dentition and multiple broken teeth with dental caries.  He has noticed some mild lower jaw swelling.  He does not currently have a dentist.  Gums are swollen and tender to touch.  Denies any fever, nausea, vomiting.  Past Medical History:  Diagnosis Date  . Arthritis   . Bradycardia   . Hypertension     There are no problems to display for this patient.   Past Surgical History:  Procedure Laterality Date  . HERNIA REPAIR         Home Medications    Prior to Admission medications   Medication Sig Start Date End Date Taking? Authorizing Provider  lisinopril-hydrochlorothiazide (ZESTORETIC) 20-12.5 MG tablet TK 1 T PO BID 08/18/17  Yes [provider]  amLODipine (NORVASC) 10 MG tablet Take 10 mg by mouth every morning.     [provider]  atorvastatin (LIPITOR) 10 MG tablet Take 10 mg by mouth every morning.    [provider]  furosemide (LASIX) 40 MG tablet Take 40 mg by mouth 2 (two) times daily as needed. 04/04/19   [provider]  SUBOXONE 8-2 MG FILM PLACE 1 1/2 FILMS UNDER TONGUE DAILY 03/28/17   [provider]  traMADol (ULTRAM) 50 MG tablet Take 1 tablet (50 mg total) by mouth every 6 (six) hours as needed. 11/06/17   Carmin Muskrat, MD    Family History Family History  Problem Relation Age of Onset  . Cancer Mother   . Hypertension Mother   . Diabetes Mother   . Cancer Father     Social History Social History   Tobacco Use  . Smoking status: Current Every Day Smoker    Packs/day: 0.50    Years: 25.00    Pack years: 12.50    Types: Cigarettes  . Smokeless tobacco: Never Used  Substance Use Topics    . Alcohol use: No  . Drug use: No     Allergies   Sulfa antibiotics   Review of Systems Review of Systems Pertinent negatives listed in HPI  Physical Exam Triage Vital Signs ED Triage Vitals  Enc Vitals Group     BP 04/27/19 1110 133/74     Pulse Rate 04/27/19 1110 (!) 47     Resp 04/27/19 1110 18     Temp 04/27/19 1110 98.5 F (36.9 C)     Temp Source 04/27/19 1110 Oral     SpO2 04/27/19 1110 100 %     Weight --      Height --      Head Circumference --      Peak Flow --      Pain Score 04/27/19 1109 8     Pain Loc --      Pain Edu? --      Excl. in [place]? --    No data found.  Updated Vital Signs BP 133/74 (BP Location: Left Arm)   Pulse (!) 47 Comment: Pt states hi HR is always low  Temp 98.5 F (36.9 C) (Oral)   Resp 18   SpO2 100%   Visual Acuity Right Eye Distance:  Left Eye Distance:   Bilateral Distance:    Right Eye Near:   Left Eye Near:    Bilateral Near:     Physical Exam  General appearance: alert, well developed, well nourished, cooperative and in no distress Head: Normocephalic, without obvious abnormality, atraumatic Oral Cavity: Multiple dental caries present, gingival inflammation present Respiratory: Respirations even and unlabored, normal respiratory rate Heart: rate and rhythm normal. No gallop or murmurs noted on exam  Abdomen: BS +, no distention, no rebound tenderness, or no mass Extremities: No gross deformities Skin: Skin color, texture, turgor normal. No rashes seen  Psych: Appropriate mood and affect. Neurologic: Alert, oriented to person, place, and time, thought content appropriate. UC Treatments / Results  Labs (all labs ordered are listed, but only abnormal results are displayed) Labs Reviewed - No data to display  EKG   Radiology No results found.  Procedures Procedures (including critical care time)  Medications Ordered in UC Medications - No data to display  Initial Impression / Assessment and Plan /  UC Course  I have reviewed the triage vital signs and the nursing notes.  Pertinent labs & imaging results that were available during my care of the patient were reviewed by me and considered in my medical decision making (see chart for details).    Pain, dental Secondary to dental infection.   Patient given clindamycin 300 mg 3 times daily x 10 days. Naproxen 500 mg twice daily as needed for pain. Patient given contact information for urgent tooth to follow-up on Monday to have teeth extracted.   Final Clinical Impressions(s) / UC Diagnoses   Final diagnoses:  Pain, dental     Discharge Instructions     Call dentist on Monday.      ED Prescriptions    Medication Sig Dispense Auth. Provider   clindamycin (CLEOCIN) 300 MG capsule Take 1 capsule (300 mg total) by mouth 3 (three) times daily for 10 days. 30 capsule Scot Jun, FNP   naproxen (NAPROSYN) 500 MG tablet Take 1 tablet (500 mg total) by mouth 2 (two) times daily with a meal for 14 days. 28 tablet Scot Jun, FNP     PDMP not reviewed this encounter.   Scot Jun, FNP 04/27/19 [phone]

## 2019-04-27 NOTE — ED Triage Notes (Signed)
Pt presents with dental pain and left sided jaw pain x 3-4 days. Pt reports Aleve helps with the pain.

## 2019-04-27 NOTE — Discharge Instructions (Signed)
Call dentist on Monday.

## 2019-06-21 DIAGNOSIS — Z20828 Contact with and (suspected) exposure to other viral communicable diseases: Secondary | ICD-10-CM | POA: Diagnosis not present

## 2019-07-16 ENCOUNTER — Other Ambulatory Visit: Payer: Self-pay

## 2019-07-16 ENCOUNTER — Encounter (HOSPITAL_COMMUNITY): Payer: Self-pay | Admitting: *Deleted

## 2019-07-16 ENCOUNTER — Emergency Department (HOSPITAL_COMMUNITY)
Admission: EM | Admit: 2019-07-16 | Discharge: 2019-07-16 | Disposition: A | Discharge status: Home / Self Care | Payer: Medicare Other | Attending: Emergency Medicine | Admitting: Emergency Medicine

## 2019-07-16 ENCOUNTER — Emergency Department (HOSPITAL_COMMUNITY): Payer: Medicare Other

## 2019-07-16 DIAGNOSIS — K219 Gastro-esophageal reflux disease without esophagitis: Secondary | ICD-10-CM | POA: Diagnosis not present

## 2019-07-16 DIAGNOSIS — R0789 Other chest pain: Secondary | ICD-10-CM

## 2019-07-16 DIAGNOSIS — K2100 Gastro-esophageal reflux disease with esophagitis, without bleeding: Secondary | ICD-10-CM | POA: Diagnosis not present

## 2019-07-16 DIAGNOSIS — D649 Anemia, unspecified: Secondary | ICD-10-CM | POA: Diagnosis not present

## 2019-07-16 DIAGNOSIS — F17210 Nicotine dependence, cigarettes, uncomplicated: Secondary | ICD-10-CM | POA: Insufficient documentation

## 2019-07-16 DIAGNOSIS — R079 Chest pain, unspecified: Secondary | ICD-10-CM | POA: Diagnosis present

## 2019-07-16 DIAGNOSIS — I10 Essential (primary) hypertension: Secondary | ICD-10-CM | POA: Diagnosis not present

## 2019-07-16 DIAGNOSIS — E876 Hypokalemia: Secondary | ICD-10-CM | POA: Diagnosis not present

## 2019-07-16 DIAGNOSIS — Z79899 Other long term (current) drug therapy: Secondary | ICD-10-CM | POA: Insufficient documentation

## 2019-07-16 DIAGNOSIS — K029 Dental caries, unspecified: Secondary | ICD-10-CM

## 2019-07-16 LAB — CBC WITH DIFFERENTIAL/PLATELET
Abs Immature Granulocytes: 0.03 10*3/uL (ref 0.00–0.07)
Basophils Absolute: 0 10*3/uL (ref 0.0–0.1)
Basophils Relative: 0 %
Eosinophils Absolute: 0.2 10*3/uL (ref 0.0–0.5)
Eosinophils Relative: 3 %
HCT: 38.4 % — ABNORMAL LOW (ref 39.0–52.0)
Hemoglobin: 12.8 g/dL — ABNORMAL LOW (ref 13.0–17.0)
Immature Granulocytes: 0 %
Lymphocytes Relative: 27 %
Lymphs Abs: 2 10*3/uL (ref 0.7–4.0)
MCH: 28.7 pg (ref 26.0–34.0)
MCHC: 33.3 g/dL (ref 30.0–36.0)
MCV: 86.1 fL (ref 80.0–100.0)
Monocytes Absolute: 0.8 10*3/uL (ref 0.1–1.0)
Monocytes Relative: 10 %
Neutro Abs: 4.3 10*3/uL (ref 1.7–7.7)
Neutrophils Relative %: 60 %
Platelets: 207 10*3/uL (ref 150–400)
RBC: 4.46 MIL/uL (ref 4.22–5.81)
RDW: 13.7 % (ref 11.5–15.5)
WBC: 7.3 10*3/uL (ref 4.0–10.5)
nRBC: 0 % (ref 0.0–0.2)

## 2019-07-16 LAB — BASIC METABOLIC PANEL
Anion gap: 9 (ref 5–15)
BUN: 12 mg/dL (ref 8–23)
CO2: 28 mmol/L (ref 22–32)
Calcium: 8.8 mg/dL — ABNORMAL LOW (ref 8.9–10.3)
Chloride: 101 mmol/L (ref 98–111)
Creatinine, Ser: 0.77 mg/dL (ref 0.61–1.24)
GFR calc Af Amer: >60 mL/min (ref >60)
GFR calc non Af Amer: >60 mL/min (ref >60)
Glucose, Bld: 101 mg/dL — ABNORMAL HIGH (ref 70–99)
Potassium: 3.4 mmol/L — ABNORMAL LOW (ref 3.5–5.1)
Sodium: 138 mmol/L (ref 135–145)

## 2019-07-16 LAB — TROPONIN I (HIGH SENSITIVITY)
Troponin I (High Sensitivity): 13 ng/L
Troponin I (High Sensitivity): 13 ng/L (ref <18)

## 2019-07-16 MED ORDER — POTASSIUM CHLORIDE CRYS ER 20 MEQ PO TBCR
20.0000 meq | EXTENDED_RELEASE_TABLET | Freq: Every day | ORAL | 0 refills | Status: DC
Start: 2019-07-16 — End: 2020-07-15

## 2019-07-16 MED ORDER — ASPIRIN 81 MG PO CHEW
324.0000 mg | CHEWABLE_TABLET | Freq: Once | ORAL | Status: AC
  Administered 2019-07-16: 06:00:00 324 mg via ORAL
  Filled 2019-07-16: qty 4

## 2019-07-16 MED ORDER — LIDOCAINE VISCOUS HCL 2 % MT SOLN
15.0000 mL | Freq: Once | OROMUCOSAL | Status: AC
  Administered 2019-07-16: 15 mL via ORAL
  Filled 2019-07-16: qty 15

## 2019-07-16 MED ORDER — AMOXICILLIN 500 MG PO CAPS
1000.0000 mg | ORAL_CAPSULE | Freq: Once | ORAL | Status: AC
  Administered 2019-07-16: 1000 mg via ORAL
  Filled 2019-07-16: qty 2

## 2019-07-16 MED ORDER — AMOXICILLIN 500 MG PO CAPS
1000.0000 mg | ORAL_CAPSULE | Freq: Two times a day (BID) | ORAL | 0 refills | Status: DC
Start: 2019-07-16 — End: 2019-08-11

## 2019-07-16 MED ORDER — ALUM & MAG HYDROXIDE-SIMETH 200-200-20 MG/5ML PO SUSP
30.0000 mL | Freq: Once | ORAL | Status: AC
  Administered 2019-07-16: 30 mL via ORAL
  Filled 2019-07-16: qty 30

## 2019-07-16 NOTE — ED Provider Notes (Signed)
[facility] DEPT Provider Note   CSN: [id number] Arrival date & time: 07/16/19  0419     History Chief Complaint  Patient presents with  . Chest Pain    [name] is a 64 y.o. male.  The history is provided by the patient.  Chest Pain He has history of hypertension and comes in because of chest pain since 5 PM.  Pain started after eating a spaghetti dinner.  It is located in the midsternal area and is described as a dull pain.  Nothing makes it better, nothing makes it worse.  There is no associated dyspnea, nausea, diaphoresis.  Pain is rated at 7/10.  It tends to come in waves which last for about 20 minutes before subsiding.  He thought it might be acid reflux and took some antacid pills which did not give relief, but he states that they were very old and may have been outdated.  He is also complaining of a toothache and a left lower incisor.  He is a cigarette smoker but denies history of diabetes or hyperlipidemia.  There is a family history of premature coronary atherosclerosis (his mother died at age 53 of coronary disease).     Past Medical History:  Diagnosis Date  . Arthritis   . Bradycardia   . Hypertension     There are no problems to display for this patient.   Past Surgical History:  Procedure Laterality Date  . HERNIA REPAIR         Family History  Problem Relation Age of Onset  . Cancer Mother   . Hypertension Mother   . Diabetes Mother   . Cancer Father     Social History   Tobacco Use  . Smoking status: Current Every Day Smoker    Packs/day: 0.50    Years: 25.00    Pack years: 12.50    Types: Cigarettes  . Smokeless tobacco: Never Used  Substance Use Topics  . Alcohol use: No  . Drug use: No    Home Medications Prior to Admission medications   Medication Sig Start Date End Date Taking? Authorizing Provider  amLODipine (NORVASC) 10 MG tablet Take 10 mg by mouth every morning.     [provider]    atorvastatin (LIPITOR) 10 MG tablet Take 10 mg by mouth every morning.    [provider]  furosemide (LASIX) 40 MG tablet Take 40 mg by mouth 2 (two) times daily as needed. 04/04/19   [provider]  lisinopril-hydrochlorothiazide (ZESTORETIC) 20-12.5 MG tablet TK 1 T PO BID 08/18/17   [provider]  SUBOXONE 8-2 MG FILM PLACE 1 1/2 FILMS UNDER TONGUE DAILY 03/28/17   [provider]  traMADol (ULTRAM) 50 MG tablet Take 1 tablet (50 mg total) by mouth every 6 (six) hours as needed. 11/06/17   Carmin Muskrat, MD    Allergies    Sulfa antibiotics  Review of Systems   Review of Systems  Cardiovascular: Positive for chest pain.  All other systems reviewed and are negative.   Physical Exam Updated Vital Signs BP (!) 179/93 (BP Location: Left Arm)   Pulse (!) 56   Temp 98.5 F (36.9 C) (Oral)   Resp 20   Ht 5\' 11"  (1.803 m)   Wt 93 kg   SpO2 97%   BMI 28.59 kg/m   Physical Exam Vitals and nursing note reviewed.   64 year old male, resting comfortably and in no acute distress. Vital signs  are significant for elevated blood pressure and slightly slow heart rate. Oxygen saturation is 97%, which is normal. Head is normocephalic and atraumatic. PERRLA, EOMI. Oropharynx is clear.  Extensive caries noted on tooth #23.  This tooth is tender to percussion.  No gingival swelling, pallor, erythema. Neck is nontender and supple without adenopathy or JVD. Back is nontender and there is no CVA tenderness. Lungs are clear without rales, wheezes, or rhonchi. Chest is nontender. Heart has regular rate and rhythm without murmur. Abdomen is soft, flat, nontender without masses or hepatosplenomegaly and peristalsis is normoactive. Extremities have no cyanosis or edema, full range of motion is present. Skin is warm and dry without rash. Neurologic: Mental status is normal, cranial nerves are intact, there are no motor or sensory deficits.  ED Results /  Procedures / Treatments   Labs (all labs ordered are listed, but only abnormal results are displayed) Labs Reviewed  BASIC METABOLIC PANEL - Abnormal; Notable for the following components:      Result Value   Potassium 3.4 (*)    Glucose, Bld 101 (*)    Calcium 8.8 (*)    All other components within normal limits  CBC WITH DIFFERENTIAL/PLATELET - Abnormal; Notable for the following components:   Hemoglobin 12.8 (*)    HCT 38.4 (*)    All other components within normal limits  TROPONIN I (HIGH SENSITIVITY)  TROPONIN I (HIGH SENSITIVITY)    EKG EKG Interpretation  Date/Time:  Tuesday Jul 16 2019 04:30:12 EDT Ventricular Rate:  54 PR Interval:    QRS Duration: 122 QT Interval:  486 QTC Calculation: 461 R Axis:   67 Text Interpretation: Sinus rhythm Borderline prolonged PR interval Probable left atrial enlargement Nonspecific intraventricular conduction delay Abnrm T, consider ischemia, anterolateral lds When compared with ECG of 10/08/2013, Nonspecific T wave abnormality has improved Confirmed by Delora Fuel [id number]) on 07/16/2019 4:34:16 AM   Radiology DG Chest Port 1 View  Result Date: 07/16/2019 CLINICAL DATA:  Chest pain EXAM: PORTABLE CHEST 1 VIEW COMPARISON:  Radiograph 04/15/2017 FINDINGS: Few bandlike opacities in the right lung base likely reflect subsegmental atelectasis. There are additional streaky atelectatic changes in both lower lungs. No consolidation, features of edema, pneumothorax, or effusion. Slight prominence of the cardiomediastinal silhouette when compared to prior radiography may be related to the portable technique and low volumes. Cardiomediastinal contours are otherwise unremarkable. Telemetry leads overlie the chest. No acute osseous or soft tissue abnormality. Degenerative changes are present in the imaged spine and shoulders. IMPRESSION: 1. Bibasilar subsegmental atelectasis. No other acute cardiopulmonary abnormality. 2. Slight prominence of the  cardiomediastinal silhouette may be related to the portable technique and low volumes although cardiomegaly or pericardial effusion could have this appearance. Electronically Signed   By: Lovena Le M.D.   On: 07/16/2019 05:33    Procedures Procedures   Medications Ordered in ED Medications  amoxicillin (AMOXIL) capsule 1,000 mg (has no administration in time range)  aspirin chewable tablet 324 mg (324 mg Oral Given 07/16/19 0533)  alum & mag hydroxide-simeth (MAALOX/MYLANTA) 200-200-20 MG/5ML suspension 30 mL (30 mLs Oral Given 07/16/19 0535)    And  lidocaine (XYLOCAINE) 2 % viscous mouth solution 15 mL (15 mLs Oral Given 07/16/19 0535)    ED Course  I have reviewed the triage vital signs and the nursing notes.  Pertinent labs & imaging results that were available during my care of the patient were reviewed by me and considered in my medical decision making (see chart for  details).  MDM Rules/Calculators/A&P Chest pain of uncertain cause.  He does have significant risk factors for cardiac disease.  Heart pathway score is 4 which puts him at elevated risk of major adverse cardiac events in the next 6 weeks.  Old records are reviewed, and he has had ED visits for atypical chest pain several years ago.  Dental caries, which to put him at increased risk for cardiovascular disease.  ECG does show nonspecific T wave abnormalities, but these are improved compared with ECG from 2015.  Troponin is normal x2.  He had excellent relief of pain with GI cocktail.  Other labs are significant for borderline hypokalemia and mild anemia, both of which are actually improved over most recent values.  He is given a dose of amoxicillin regarding his tooth and is advised to use omeprazole for his esophageal pain.  He is given prescription for K. Dur for hypokalemia.  He is advised to follow-up with his dentist as soon as possible.  Final Clinical Impression(s) / ED Diagnoses Final diagnoses:  Atypical chest pain    Hypokalemia  Normochromic normocytic anemia  Gastroesophageal reflux disease with esophagitis without hemorrhage    Rx / DC Orders ED Discharge Orders    None       Delora Fuel, MD [id number] [phone]

## 2019-07-16 NOTE — Discharge Instructions (Addendum)
Take omeprazole once a day.  See a dentist regarding your tooth - antibiotics will not fix it.

## 2019-07-16 NOTE — ED Triage Notes (Signed)
Pain that started after dinner last night (he ate spaghetti) in the center of his chest, associated with SOB.

## 2019-08-06 DIAGNOSIS — E785 Hyperlipidemia, unspecified: Secondary | ICD-10-CM | POA: Diagnosis not present

## 2019-08-06 DIAGNOSIS — Z1211 Encounter for screening for malignant neoplasm of colon: Secondary | ICD-10-CM | POA: Diagnosis not present

## 2019-08-06 DIAGNOSIS — Z0000 Encounter for general adult medical examination without abnormal findings: Secondary | ICD-10-CM | POA: Diagnosis not present

## 2019-08-06 DIAGNOSIS — Z9181 History of falling: Secondary | ICD-10-CM | POA: Diagnosis not present

## 2019-08-06 DIAGNOSIS — Z1331 Encounter for screening for depression: Secondary | ICD-10-CM | POA: Diagnosis not present

## 2019-08-09 DIAGNOSIS — E785 Hyperlipidemia, unspecified: Secondary | ICD-10-CM | POA: Diagnosis not present

## 2019-08-09 DIAGNOSIS — I10 Essential (primary) hypertension: Secondary | ICD-10-CM | POA: Diagnosis not present

## 2019-08-09 DIAGNOSIS — M159 Polyosteoarthritis, unspecified: Secondary | ICD-10-CM | POA: Diagnosis not present

## 2019-08-09 DIAGNOSIS — E291 Testicular hypofunction: Secondary | ICD-10-CM | POA: Diagnosis not present

## 2019-08-09 DIAGNOSIS — M25561 Pain in right knee: Secondary | ICD-10-CM | POA: Diagnosis not present

## 2019-08-09 DIAGNOSIS — N529 Male erectile dysfunction, unspecified: Secondary | ICD-10-CM | POA: Diagnosis not present

## 2019-08-11 ENCOUNTER — Ambulatory Visit (HOSPITAL_COMMUNITY)
Admission: EM | Admit: 2019-08-11 | Discharge: 2019-08-11 | Disposition: A | Discharge status: Home / Self Care | Payer: Medicare Other | Attending: Family Medicine | Admitting: Family Medicine

## 2019-08-11 ENCOUNTER — Encounter (HOSPITAL_COMMUNITY): Payer: Self-pay

## 2019-08-11 DIAGNOSIS — K047 Periapical abscess without sinus: Secondary | ICD-10-CM

## 2019-08-11 MED ORDER — AMOXICILLIN-POT CLAVULANATE 875-125 MG PO TABS
1.0000 | ORAL_TABLET | Freq: Two times a day (BID) | ORAL | 0 refills | Status: DC
Start: 2019-08-11 — End: 2020-07-15

## 2019-08-11 MED ORDER — CHLORHEXIDINE GLUCONATE 0.12 % MT SOLN
15.0000 mL | Freq: Two times a day (BID) | OROMUCOSAL | 0 refills | Status: DC
Start: 2019-08-11 — End: 2020-07-15

## 2019-08-11 NOTE — ED Provider Notes (Signed)
[place]    CSN: [id number] Arrival date & time: 08/11/19  1504      History   Chief Complaint Chief Complaint  Patient presents with  . Dental Pain    HPI [name] is a 64 y.o. male.   Patient is a 64 year old male presents today for chronic left lower dental pain, oral swelling.  Has been treated for this this to be the third time this year.  Just in his course few weeks ago.  Started having the symptoms a few days ago.  Feels like the infection never cleared from previous.  Does not have a dentist.  No fevers, chills, trismus.  Has been using Aleve.  ROS per HPI      Past Medical History:  Diagnosis Date  . Arthritis   . Bradycardia   . Hypertension     There are no problems to display for this patient.   Past Surgical History:  Procedure Laterality Date  . HERNIA REPAIR         Home Medications    Prior to Admission medications   Medication Sig Start Date End Date Taking? Authorizing Provider  amLODipine (NORVASC) 10 MG tablet Take 10 mg by mouth every morning.     [provider]  amoxicillin-clavulanate (AUGMENTIN) 875-125 MG tablet Take 1 tablet by mouth every 12 (twelve) hours. 08/11/19   Loura Halt A, NP  aspirin EC 81 MG tablet Take 81 mg by mouth daily.    [provider]  atorvastatin (LIPITOR) 10 MG tablet Take 10 mg by mouth every morning.    [provider]  chlorhexidine (PERIDEX) 0.12 % solution Use as directed 15 mLs in the mouth or throat 2 (two) times daily. 08/11/19   Loura Halt A, NP  furosemide (LASIX) 40 MG tablet Take 40 mg by mouth 2 (two) times daily as needed for fluid.  04/04/19   [provider]  potassium chloride SA (KLOR-CON) 20 MEQ tablet Take 1 tablet (20 mEq total) by mouth daily. [id number]   Delora Fuel, MD    Family History Family History  Problem Relation Age of Onset  . Cancer Mother   . Hypertension Mother   . Diabetes Mother   . Cancer Father     Social  History Social History   Tobacco Use  . Smoking status: Current Every Day Smoker    Packs/day: 0.50    Years: 25.00    Pack years: 12.50    Types: Cigarettes  . Smokeless tobacco: Never Used  Substance Use Topics  . Alcohol use: No  . Drug use: No     Allergies   Sulfa antibiotics   Review of Systems Review of Systems   Physical Exam Triage Vital Signs ED Triage Vitals  Enc Vitals Group     BP 08/11/19 1603 (!) 157/79     Pulse Rate 08/11/19 1603 (!) 49     Resp 08/11/19 1603 20     Temp 08/11/19 1603 98.2 F (36.8 C)     Temp src --      SpO2 08/11/19 1603 95 %     Weight --      Height --      Head Circumference --      Peak Flow --      Pain Score 08/11/19 1602 7     Pain Loc --      Pain Edu? --      Excl. in [place]? --  No data found.  Updated Vital Signs BP (!) 157/79   Pulse (!) 49   Temp 98.2 F (36.8 C)   Resp 20   SpO2 95%   Visual Acuity Right Eye Distance:   Left Eye Distance:   Bilateral Distance:    Right Eye Near:   Left Eye Near:    Bilateral Near:     Physical Exam Vitals and nursing note reviewed.  Constitutional:      Appearance: Normal appearance.  HENT:     Head: Normocephalic and atraumatic.     Nose: Nose normal.     Mouth/Throat:     Dentition: Dental tenderness, gingival swelling and dental caries present.      Comments: Left lower facial swelling.  Eyes:     Conjunctiva/sclera: Conjunctivae normal.  Pulmonary:     Effort: Pulmonary effort is normal.  Musculoskeletal:        General: Normal range of motion.     Cervical back: Normal range of motion.  Skin:    General: Skin is warm and dry.  Neurological:     Mental Status: He is alert.  Psychiatric:        Mood and Affect: Mood normal.      UC Treatments / Results  Labs (all labs ordered are listed, but only abnormal results are displayed) Labs Reviewed - No data to display  EKG   Radiology No results found.  Procedures Procedures (including  critical care time)  Medications Ordered in UC Medications - No data to display  Initial Impression / Assessment and Plan / UC Course  I have reviewed the triage vital signs and the nursing notes.  Pertinent labs & imaging results that were available during my care of the patient were reviewed by me and considered in my medical decision making (see chart for details).     Dental infection Treating with Augmentin and Peridex mouth solution. Aleve for pain as needed Follow up as needed for continued or worsening symptoms  Final Clinical Impressions(s) / UC Diagnoses   Final diagnoses:  Dental infection     Discharge Instructions     Treating for dental infection.  Use the medication as prescribed. Follow-up with dentist for further problems    ED Prescriptions    Medication Sig Dispense Auth. Provider   amoxicillin-clavulanate (AUGMENTIN) 875-125 MG tablet Take 1 tablet by mouth every 12 (twelve) hours. 14 tablet Ajee [name] A, NP   chlorhexidine (PERIDEX) 0.12 % solution Use as directed 15 mLs in the mouth or throat 2 (two) times daily. 120 mL Kamyla Olejnik A, NP     PDMP not reviewed this encounter.   Orvan July, NP 08/11/19 1644

## 2019-08-11 NOTE — Discharge Instructions (Signed)
Treating for dental infection.  Use the medication as prescribed. Follow-up with dentist for further problems

## 2019-08-11 NOTE — ED Triage Notes (Signed)
Pt presents with c/o left lower dental pain , was here in february for same

## 2019-08-16 DIAGNOSIS — F411 Generalized anxiety disorder: Secondary | ICD-10-CM | POA: Diagnosis not present

## 2019-08-16 DIAGNOSIS — M159 Polyosteoarthritis, unspecified: Secondary | ICD-10-CM | POA: Diagnosis not present

## 2019-08-16 DIAGNOSIS — E291 Testicular hypofunction: Secondary | ICD-10-CM | POA: Diagnosis not present

## 2019-08-16 DIAGNOSIS — N529 Male erectile dysfunction, unspecified: Secondary | ICD-10-CM | POA: Diagnosis not present

## 2020-03-21 DIAGNOSIS — Z1152 Encounter for screening for COVID-19: Secondary | ICD-10-CM | POA: Diagnosis not present

## 2020-04-02 DIAGNOSIS — Z79899 Other long term (current) drug therapy: Secondary | ICD-10-CM | POA: Diagnosis not present

## 2020-04-30 DIAGNOSIS — Z79899 Other long term (current) drug therapy: Secondary | ICD-10-CM | POA: Diagnosis not present

## 2020-05-28 DIAGNOSIS — Z79899 Other long term (current) drug therapy: Secondary | ICD-10-CM | POA: Diagnosis not present

## 2020-06-25 DIAGNOSIS — Z79899 Other long term (current) drug therapy: Secondary | ICD-10-CM | POA: Diagnosis not present

## 2020-07-14 ENCOUNTER — Emergency Department (HOSPITAL_COMMUNITY)
Admission: EM | Admit: 2020-07-14 | Discharge: 2020-07-15 | Disposition: A | Discharge status: Home / Self Care | Payer: Medicare Other | Attending: Emergency Medicine | Admitting: Emergency Medicine

## 2020-07-14 ENCOUNTER — Emergency Department (HOSPITAL_COMMUNITY): Payer: Medicare Other

## 2020-07-14 ENCOUNTER — Encounter (HOSPITAL_COMMUNITY): Payer: Self-pay

## 2020-07-14 DIAGNOSIS — Z20822 Contact with and (suspected) exposure to covid-19: Secondary | ICD-10-CM | POA: Diagnosis not present

## 2020-07-14 DIAGNOSIS — I110 Hypertensive heart disease with heart failure: Secondary | ICD-10-CM | POA: Insufficient documentation

## 2020-07-14 DIAGNOSIS — F17210 Nicotine dependence, cigarettes, uncomplicated: Secondary | ICD-10-CM | POA: Diagnosis not present

## 2020-07-14 DIAGNOSIS — Z7982 Long term (current) use of aspirin: Secondary | ICD-10-CM | POA: Insufficient documentation

## 2020-07-14 DIAGNOSIS — R0600 Dyspnea, unspecified: Secondary | ICD-10-CM

## 2020-07-14 DIAGNOSIS — I509 Heart failure, unspecified: Secondary | ICD-10-CM | POA: Diagnosis not present

## 2020-07-14 DIAGNOSIS — Z79899 Other long term (current) drug therapy: Secondary | ICD-10-CM | POA: Diagnosis not present

## 2020-07-14 DIAGNOSIS — I712 Thoracic aortic aneurysm, without rupture: Secondary | ICD-10-CM | POA: Diagnosis not present

## 2020-07-14 DIAGNOSIS — R0602 Shortness of breath: Secondary | ICD-10-CM | POA: Diagnosis not present

## 2020-07-14 DIAGNOSIS — N281 Cyst of kidney, acquired: Secondary | ICD-10-CM | POA: Diagnosis not present

## 2020-07-14 DIAGNOSIS — I517 Cardiomegaly: Secondary | ICD-10-CM | POA: Diagnosis not present

## 2020-07-14 MED ORDER — ALUM & MAG HYDROXIDE-SIMETH 200-200-20 MG/5ML PO SUSP
30.0000 mL | Freq: Once | ORAL | Status: AC
  Administered 2020-07-15: 30 mL via ORAL
  Filled 2020-07-14: qty 30

## 2020-07-14 NOTE — ED Triage Notes (Signed)
Pt c/o SOB x 3 days that is worse with laying down. Denies CP, leg swelling, cough or fevers

## 2020-07-14 NOTE — ED Provider Notes (Signed)
Emergency Medicine Provider Triage Evaluation Note  [name] , a 64 y.o. male  was evaluated in triage.  Pt complains of feeling shortness of breath.  He denies any chest pain.  No fevers.  He states he came about 1 year ago to the emergency department with something similar and symptoms improved with something to drink.  He suspects may be his blood pressure is high also.  He does smoke and knows he needs to quit.  Review of Systems  Positive: Positive SOB  Negative: Denies CP, fever, chills, diaphoresis.   Physical Exam  BP (!) 176/105 (BP Location: Left Arm)   Pulse (!) 58   Temp 98.2 F (36.8 C) (Oral)   Resp 20   Ht 5\' 11"  (1.803 m)   Wt 93 kg   SpO2 94%   BMI 28.59 kg/m  Gen:   Awake, no distress, conversational  Resp:  Normal effort, no wheezing.  MSK:   Moves extremities without difficulty. No deformity.  Other:  No obvious rashes.    Medical Decision Making  Medically screening exam initiated at 11:29 PM.  Appropriate orders placed.  [name] was informed that the remainder of the evaluation will be completed by another provider, this initial triage assessment does not replace that evaluation, and the importance of remaining in the ED until their evaluation is complete.  Labs, CXR, and GI cocktail ordered. In review of ED chart from 05/21 this seemed to improve symptoms.    Todd Fast, MD 07/14/20 [phone]

## 2020-07-15 ENCOUNTER — Emergency Department (HOSPITAL_COMMUNITY): Payer: Medicare Other

## 2020-07-15 DIAGNOSIS — I509 Heart failure, unspecified: Secondary | ICD-10-CM | POA: Diagnosis not present

## 2020-07-15 DIAGNOSIS — I712 Thoracic aortic aneurysm, without rupture: Secondary | ICD-10-CM | POA: Diagnosis not present

## 2020-07-15 DIAGNOSIS — I110 Hypertensive heart disease with heart failure: Secondary | ICD-10-CM | POA: Diagnosis not present

## 2020-07-15 DIAGNOSIS — R0602 Shortness of breath: Secondary | ICD-10-CM | POA: Diagnosis not present

## 2020-07-15 DIAGNOSIS — N281 Cyst of kidney, acquired: Secondary | ICD-10-CM | POA: Diagnosis not present

## 2020-07-15 DIAGNOSIS — I517 Cardiomegaly: Secondary | ICD-10-CM | POA: Diagnosis not present

## 2020-07-15 LAB — COMPREHENSIVE METABOLIC PANEL
ALT: 14 U/L (ref 0–44)
AST: 23 U/L (ref 15–41)
Albumin: 4.2 g/dL (ref 3.5–5.0)
Alkaline Phosphatase: 37 U/L — ABNORMAL LOW (ref 38–126)
Anion gap: 5 (ref 5–15)
BUN: 12 mg/dL (ref 8–23)
CO2: 30 mmol/L (ref 22–32)
Calcium: 9.3 mg/dL (ref 8.9–10.3)
Chloride: 109 mmol/L (ref 98–111)
Creatinine, Ser: 0.85 mg/dL (ref 0.61–1.24)
GFR, Estimated: >60 mL/min (ref >60)
Glucose, Bld: 93 mg/dL (ref 70–99)
Potassium: 3.4 mmol/L — ABNORMAL LOW (ref 3.5–5.1)
Sodium: 144 mmol/L (ref 135–145)
Total Bilirubin: 0.4 mg/dL (ref 0.3–1.2)
Total Protein: 7.2 g/dL (ref 6.5–8.1)

## 2020-07-15 LAB — D-DIMER, QUANTITATIVE: D-Dimer, Quant: 0.53 ug/mL-FEU — ABNORMAL HIGH (ref 0.00–0.50)

## 2020-07-15 LAB — CBC WITH DIFFERENTIAL/PLATELET
Abs Immature Granulocytes: 0.03 10*3/uL (ref 0.00–0.07)
Basophils Absolute: 0 10*3/uL (ref 0.0–0.1)
Basophils Relative: 1 %
Eosinophils Absolute: 0.2 10*3/uL (ref 0.0–0.5)
Eosinophils Relative: 3 %
HCT: 36.2 % — ABNORMAL LOW (ref 39.0–52.0)
Hemoglobin: 12 g/dL — ABNORMAL LOW (ref 13.0–17.0)
Immature Granulocytes: 1 %
Lymphocytes Relative: 38 %
Lymphs Abs: 2.1 10*3/uL (ref 0.7–4.0)
MCH: 28.9 pg (ref 26.0–34.0)
MCHC: 33.1 g/dL (ref 30.0–36.0)
MCV: 87.2 fL (ref 80.0–100.0)
Monocytes Absolute: 0.7 10*3/uL (ref 0.1–1.0)
Monocytes Relative: 12 %
Neutro Abs: 2.6 10*3/uL (ref 1.7–7.7)
Neutrophils Relative %: 45 %
Platelets: 210 10*3/uL (ref 150–400)
RBC: 4.15 MIL/uL — ABNORMAL LOW (ref 4.22–5.81)
RDW: 13.9 % (ref 11.5–15.5)
WBC: 5.6 10*3/uL (ref 4.0–10.5)
nRBC: 0 % (ref 0.0–0.2)

## 2020-07-15 LAB — RESP PANEL BY RT-PCR (FLU A&B, COVID) ARPGX2
Influenza A by PCR: NEGATIVE
Influenza B by PCR: NEGATIVE
SARS Coronavirus 2 by RT PCR: NEGATIVE

## 2020-07-15 LAB — LIPASE, BLOOD: Lipase: 27 U/L (ref 11–51)

## 2020-07-15 LAB — TROPONIN I (HIGH SENSITIVITY)
Troponin I (High Sensitivity): 15 ng/L (ref <18)
Troponin I (High Sensitivity): 17 ng/L (ref <18)

## 2020-07-15 LAB — BRAIN NATRIURETIC PEPTIDE: B Natriuretic Peptide: 122.9 pg/mL — ABNORMAL HIGH (ref 0.0–100.0)

## 2020-07-15 MED ORDER — IOHEXOL 350 MG/ML SOLN
100.0000 mL | Freq: Once | INTRAVENOUS | Status: AC | PRN
  Administered 2020-07-15: 100 mL via INTRAVENOUS

## 2020-07-15 MED ORDER — FUROSEMIDE 20 MG PO TABS
20.0000 mg | ORAL_TABLET | Freq: Every day | ORAL | 0 refills | Status: AC
Start: 2020-07-15 — End: ?

## 2020-07-15 MED ORDER — FUROSEMIDE 10 MG/ML IJ SOLN
20.0000 mg | Freq: Once | INTRAMUSCULAR | Status: AC
  Administered 2020-07-15: 20 mg via INTRAVENOUS
  Filled 2020-07-15: qty 4

## 2020-07-15 MED ORDER — AMLODIPINE BESYLATE 5 MG PO TABS
10.0000 mg | ORAL_TABLET | Freq: Once | ORAL | Status: AC
  Administered 2020-07-15: 10 mg via ORAL
  Filled 2020-07-15: qty 2

## 2020-07-15 NOTE — ED Provider Notes (Signed)
[facility] DEPT Provider Note   CSN: [id number] Arrival date & time: 07/14/20  2315     History Chief Complaint  Patient presents with  . Shortness of Breath    [name] is a 65 y.o. male.  Patient with a history of hypertension and bradycardia here with shortness of breath for the past 3 days.  He states he mostly notices this when he tries to lie down and feels like he cannot breathe.  He denies any exertional shortness of breath.  No chest pain, cough or fever.  States no history of asthma or COPD but he does smoke.  Never been checked for sleep apnea states he lies on his side and normally can sleep flat for the past several days has had to sleep propped up.  No leg pain or leg swelling.  No chest pain, cough or fever.  He was seen in the ED about 1 year ago with atypical chest pain that improved with a GI cocktail.  States he has not had any chest pain currently.  He is supposed to be on amlodipine and furosemide for his blood pressure but admits that he is not very compliant with these.  He did take them for the past 2 days but has not taken them for a while despite this. No leg pain or leg swelling.  No pain with urination or hematuria.  No fever, cough.  The history is provided by the patient.  Shortness of Breath Associated symptoms: no abdominal pain, no chest pain, no cough, no fever, no headaches and no vomiting        Past Medical History:  Diagnosis Date  . Arthritis   . Bradycardia   . Hypertension     There are no problems to display for this patient.   Past Surgical History:  Procedure Laterality Date  . HERNIA REPAIR         Family History  Problem Relation Age of Onset  . Cancer Mother   . Hypertension Mother   . Diabetes Mother   . Cancer Father     Social History   Tobacco Use  . Smoking status: Current Every Day Smoker    Packs/day: 0.50    Years: 25.00    Pack years: 12.50    Types: Cigarettes  .  Smokeless tobacco: Never Used  Vaping Use  . Vaping Use: Never used  Substance Use Topics  . Alcohol use: No  . Drug use: No    Home Medications Prior to Admission medications   Medication Sig Start Date End Date Taking? Authorizing Provider  amLODipine (NORVASC) 10 MG tablet Take 10 mg by mouth every morning.     [provider]  amoxicillin-clavulanate (AUGMENTIN) 875-125 MG tablet Take 1 tablet by mouth every 12 (twelve) hours. 08/11/19   Loura Halt A, NP  aspirin EC 81 MG tablet Take 81 mg by mouth daily.    [provider]  atorvastatin (LIPITOR) 10 MG tablet Take 10 mg by mouth every morning.    [provider]  chlorhexidine (PERIDEX) 0.12 % solution Use as directed 15 mLs in the mouth or throat 2 (two) times daily. 08/11/19   Loura Halt A, NP  furosemide (LASIX) 40 MG tablet Take 40 mg by mouth 2 (two) times daily as needed for fluid.  04/04/19   [provider]  potassium chloride SA (KLOR-CON) 20 MEQ tablet Take 1 tablet (20 mEq total) by mouth daily. 09/19/91   Roxanne Mins,  Shanon Brow, MD    Allergies    Sulfa antibiotics  Review of Systems   Review of Systems  Constitutional: Negative for activity change, appetite change and fever.  HENT: Negative for congestion and rhinorrhea.   Eyes: Negative for visual disturbance.  Respiratory: Positive for shortness of breath. Negative for cough and chest tightness.   Cardiovascular: Negative for chest pain and leg swelling.  Gastrointestinal: Negative for abdominal pain, nausea and vomiting.  Genitourinary: Negative for dysuria and hematuria.  Musculoskeletal: Negative for arthralgias and myalgias.  Skin: Negative for wound.  Neurological: Negative for dizziness, weakness and headaches.    all other systems are negative except as noted in the HPI and PMH.   Physical Exam Updated Vital Signs BP (!) 176/105 (BP Location: Left Arm)   Pulse (!) 58   Temp 98.2 F (36.8 C) (Oral)   Resp 20   Ht 5\' 11"   (1.803 m)   Wt 93 kg   SpO2 94%   BMI 28.59 kg/m   Physical Exam Vitals and nursing note reviewed.  Constitutional:      General: He is not in acute distress.    Appearance: He is well-developed. He is not ill-appearing.     Comments: No distress, speaking in full sentence  HENT:     Head: Normocephalic and atraumatic.     Mouth/Throat:     Pharynx: No oropharyngeal exudate.  Eyes:     Conjunctiva/sclera: Conjunctivae normal.     Pupils: Pupils are equal, round, and reactive to light.  Neck:     Comments: No meningismus. Cardiovascular:     Rate and Rhythm: Normal rate and regular rhythm.     Heart sounds: Normal heart sounds. No murmur heard.   Pulmonary:     Effort: Pulmonary effort is normal. No respiratory distress.     Breath sounds: Normal breath sounds. No wheezing.  Abdominal:     Palpations: Abdomen is soft.     Tenderness: There is no abdominal tenderness. There is no guarding or rebound.  Musculoskeletal:        General: No tenderness. Normal range of motion.     Cervical back: Normal range of motion and neck supple.     Right lower leg: No edema.     Left lower leg: No edema.  Skin:    General: Skin is warm.     Capillary Refill: Capillary refill takes less than 2 seconds.  Neurological:     General: No focal deficit present.     Mental Status: He is alert and oriented to person, place, and time. Mental status is at baseline.     Cranial Nerves: No cranial nerve deficit.     Motor: No abnormal muscle tone.     Coordination: Coordination normal.     Comments:  5/5 strength throughout. CN 2-12 intact.Equal grip strength.   Psychiatric:        Behavior: Behavior normal.     ED Results / Procedures / Treatments   Labs (all labs ordered are listed, but only abnormal results are displayed) Labs Reviewed  COMPREHENSIVE METABOLIC PANEL - Abnormal; Notable for the following components:      Result Value   Potassium 3.4 (*)    Alkaline Phosphatase 37 (*)     All other components within normal limits  CBC WITH DIFFERENTIAL/PLATELET - Abnormal; Notable for the following components:   RBC 4.15 (*)    Hemoglobin 12.0 (*)    HCT 36.2 (*)    All  other components within normal limits  BRAIN NATRIURETIC PEPTIDE - Abnormal; Notable for the following components:   B Natriuretic Peptide 122.9 (*)    All other components within normal limits  D-DIMER, QUANTITATIVE - Abnormal; Notable for the following components:   D-Dimer, Quant 0.53 (*)    All other components within normal limits  RESP PANEL BY RT-PCR (FLU A&B, COVID) ARPGX2  LIPASE, BLOOD  TROPONIN I (HIGH SENSITIVITY)  TROPONIN I (HIGH SENSITIVITY)    EKG EKG Interpretation  Date/Time:  Tuesday Jul 14 2020 23:34:32 EDT Ventricular Rate:  67 PR Interval:  196 QRS Duration: 121 QT Interval:  473 QTC Calculation: 376 R Axis:   44 Text Interpretation: Sinus rhythm Ventricular bigeminy Probable LVH with secondary repol abnrm 12 Lead; Mason-Likar in a pattern of bigeminy Confirmed by Ezequiel Essex [phone]) on 07/15/2020 12:01:51 AM   Radiology DG Chest Portable 1 View  Result Date: 07/14/2020 CLINICAL DATA:  Dyspnea EXAM: PORTABLE CHEST 1 VIEW COMPARISON:  07/16/2019 FINDINGS: The lungs are symmetrically well expanded. No pneumothorax or pleural effusion. Cardiac size within normal limits. The thoracic aorta is ectatic, likely accentuated by semi-erect positioning. Cardiac size within normal limits. 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 IMPRESSION: No active disease. Electronically Signed   By: Fidela Salisbury MD   On: 07/14/2020 23:59   CT ANGIO CHEST AORTA W/CM & OR WO/CM  Addendum Date: 07/15/2020   ADDENDUM REPORT: 07/15/2020 05:27 ADDENDUM: Typographic air in the initial impression.  Section should read: 1. NO thoracic aortic aneurysm, aortic dissection or  acute aortic syndrome is identified. 2. Minimal atelectatic changes, accentuated by imaging during exhalation. 3. Few secretions in the central airways, lungs otherwise clear. 4. Cardiomegaly.  Coronary artery atherosclerosis. This addendum was discussed by telephone at the time of submission on 07/15/2020 at 5:26 am to provider [facility] , who verbally acknowledged these results. Electronically Signed   By: Lovena Le M.D.   On: 07/15/2020 05:27   Result Date: 07/15/2020 CLINICAL DATA:  Orthopnea EXAM: CT ANGIOGRAPHY CHEST WITH CONTRAST TECHNIQUE: Multidetector CT imaging of the chest was performed using the standard protocol during bolus administration of intravenous contrast. Multiplanar CT image reconstructions and MIPs were obtained to evaluate the vascular anatomy. CONTRAST:  127mL OMNIPAQUE IOHEXOL 350 MG/ML SOLN COMPARISON:  Radiograph 07/14/2020 07/16/2019, CT 08/20/2013 FINDINGS: Cardiovascular: The aortic root is suboptimally assessed given cardiac pulsation artifact. Minimal atherosclerotic plaque within the normal caliber aorta. No acute luminal abnormality of the imaged aorta. No periaortic stranding or hemorrhage. No other vessel branching of the aortic arch without acute or worrisome abnormality of proximal great vessels. Mild cardiomegaly. Scattered coronary artery calcifications. No pericardial effusion. Suboptimal opacification of the pulmonary arteries no large central filling defect is evident within the limitations of this non tailored exam. Mediastinum/Nodes: No mediastinal fluid or gas. Normal thyroid gland and thoracic inlet. No acute abnormality of the esophagus. Small amount of adherant secretions in the mid trachea (10/44). Trachea and central airways are otherwise unremarkable. No worrisome mediastinal, hilar or axillary adenopathy. Lungs/Pleura: Dependent atelectatic changes, suspect some slight accentuation by imaging during exhalation. No consolidation, features of edema,  pneumothorax, or effusion. No suspicious pulmonary nodules or masses. Upper Abdomen: No acute abnormalities present in the visualized portions of the upper abdomen. Multiple hypodense renal cysts are again seen in the kidney. Stable lobular thickening of the adrenal glands without concerning dominant  nodule or mass. Noninflamed air and fluid-filled duodenal diverticulum. Musculoskeletal: Multilevel degenerative changes are present in the imaged portions of the spine. Additional degenerative changes in the shoulders. No acute osseous abnormality or suspicious osseous lesion. Stable bilateral gynecomastia, left greater than right. No acute or worrisome soft tissue abnormalities. Review of the MIP images confirms the above findings. IMPRESSION: 1. Known thoracic aortic aneurysm, aortic dissection or acute aortic syndrome is identified. 2. Minimal atelectatic changes accentuated by imaging during exhalation. 3. Few secretions in the central airways, lungs are otherwise clear. 4. Cardiomegaly.  Coronary artery atherosclerosis. Electronically Signed: By: Lovena Le M.D. On: 07/15/2020 05:14    Procedures Procedures   Medications Ordered in ED Medications  alum & mag hydroxide-simeth (MAALOX/MYLANTA) 200-200-20 MG/5ML suspension 30 mL (has no administration in time range)    ED Course  I have reviewed the triage vital signs and the nursing notes.  Pertinent labs & imaging results that were available during my care of the patient were reviewed by me and considered in my medical decision making (see chart for details).    MDM Rules/Calculators/A&P                         3 days of shortness of breath, worse with lying flat.  No chest pain, cough or fever.  Lungs are clear.  No hypoxia or increased work of breathing.  EKG with bigeminy, chest x-ray clear  Troponin negative.  Age-adjusted D-dimer is negative.  No hypoxia or wheezing on exam.  Repeat EKG with lateral T wave inversions similar to  previous EKGs in 2021.  No dyspnea with exertion and no chest pain.  Bradycardic in the 40s but is sinus and asymptomatic. BNP 122.  No echocardiogram in system.  No clinical evidence of volume overload with negative chest x-ray and no leg swelling IV Lasix given.  Troponin negative x2.  Patient ambulatory without desaturation or dyspnea. Unclear etiology of his dyspnea though does not appear to have any evidence of pneumonia.  Age-adjusted D-dimer is negative.  Low suspicion for pulmonary embolism.  Low suspicion for ACS with unchanged EKG T wave inversions and negative troponins x2. Does not appear to have evidence of volume overload.  Test dose of IV Lasix given and patient did have large-volume diuresis around 1 L and feels improved in terms of his dyspnea.  Advised he should follow-up with his PCP as well as given referral for cardiology for abnormal EKG with no echocardiogram in the system.  CTA obtained given abnormal appearance of aorta on chest x-ray.  No evidence of aortic dissection or large pulmonary embolism.  Discussed with Dr. Marguerita Merles.  Patient able to ambulate without desaturation.  Suspect he might have some undiagnosed congestive heart failure.  Advised follow-up with cardiology given his abnormal EKG though it is stable. We will give a short course of furosemide since he has been out at home.  He is comfortable with discharge and does not want to stay in  the hospital.  Understands to return exertional chest pain, worsening shortness of breath, nausea, vomiting, diaphoresis, or any other concerns.   Emile Kyllo was evaluated in Emergency Department on 07/15/2020 for the symptoms described in the history of present illness. He was evaluated in the context of the global COVID-19 pandemic, which necessitated consideration that the patient might be at risk for infection with the SARS-CoV-2 virus that causes COVID-19. Institutional protocols and algorithms that pertain to the evaluation  of  patients at risk for COVID-19 are in a state of rapid change based on information released by regulatory bodies including the CDC and federal and state organizations. These policies and algorithms were followed during the patient's care in the ED.   Final Clinical Impression(s) / ED Diagnoses Final diagnoses:  Dyspnea, unspecified type  Congestive heart failure, unspecified HF chronicity, unspecified heart failure type [facility])    Rx / DC Orders ED Discharge Orders    None       Ezequiel Essex, MD 07/15/20 [phone]

## 2020-07-15 NOTE — ED Notes (Signed)
Patients ambulated around the hallway without assistance. Patients oxygen level stayed at 98.

## 2020-07-15 NOTE — Discharge Instructions (Signed)
There is no evidence of heart attack or blood clot in the lung.  As we discussed you are low risk for heart disease but not zero risk.  You may have a component of congestive heart failure.  You should follow-up with the cardiologist for echocardiogram and stress test.  Take the water pills as prescribed.  Return to the ED with difficulty breathing, chest pain with exertion, nausea, vomiting, sweating, other concerns.

## 2020-07-15 NOTE — ED Notes (Signed)
Pt discharged from this ED in stable condition at this time. All discharge instructions and follow up care reviewed with pt with no further questions at this time. Pt ambulatory with steady gait, clear speech.  

## 2020-07-23 DIAGNOSIS — Z79899 Other long term (current) drug therapy: Secondary | ICD-10-CM | POA: Diagnosis not present

## 2020-07-24 NOTE — Progress Notes (Deleted)
Cardiology Office Note   Date:  07/24/2020   ID:  [name], DOB June 29, 1955, MRN [id number]  PCP:  [facility]  Cardiologist:  *** Todd Aslin Martin[place], MD   No chief complaint on file.     History of Present Illness: [name] is a 65 y.o. male who is seen at the request of ... for evaluation of dyspnea when supine. Seen recently in ED. Evaluation there benign. Normal D dimer. BNP 123. Troponin negative. CXR clear. Patient has a history of HTN and bradycardia.     Past Medical History:  Diagnosis Date  . Arthritis   . Bradycardia   . Hypertension     Past Surgical History:  Procedure Laterality Date  . HERNIA REPAIR       Current Outpatient Medications  Medication Sig Dispense Refill  . amLODipine (NORVASC) 10 MG tablet Take 10 mg by mouth every morning.     Marland Kitchen aspirin EC 81 MG tablet Take 81 mg by mouth daily.    Marland Kitchen atorvastatin (LIPITOR) 10 MG tablet Take 10 mg by mouth every morning.    . furosemide (LASIX) 20 MG tablet Take 1 tablet (20 mg total) by mouth daily. 10 tablet 0   No current facility-administered medications for this visit.    Allergies:   Sulfa antibiotics    Social History:  The patient  reports that he has been smoking cigarettes. He has a 12.50 pack-year smoking history. He has never used smokeless tobacco. He reports that he does not drink alcohol and does not use drugs.   Family History:  The patient's ***family history includes Cancer in his father and mother; Diabetes in his mother; Hypertension in his mother.    ROS:  Please see the history of present illness.   Otherwise, review of systems are positive for {NONE DEFAULTED:18576::"none"}.   All other systems are reviewed and negative.    PHYSICAL EXAM: VS:  There were no vitals taken for this visit. , BMI There is no height or weight on file to calculate BMI. GEN: Well nourished, well developed, in no acute distress  HEENT: normal  Neck: no JVD, carotid bruits, or  masses Cardiac: ***RRR; no murmurs, rubs, or gallops,no edema  Respiratory:  clear to auscultation bilaterally, normal work of breathing GI: soft, nontender, nondistended, + BS MS: no deformity or atrophy  Skin: warm and dry, no rash Neuro:  Strength and sensation are intact Psych: euthymic mood, full affect   EKG:  EKG {ACTION; IS/IS VZD:[id number]} ordered today. The ekg ordered today demonstrates ***   Recent Labs: 07/15/2020: ALT 14; B Natriuretic Peptide 122.9; BUN 12; Creatinine, Ser 0.85; Hemoglobin 12.0; Platelets 210; Potassium 3.4; Sodium 144    Lipid Panel    Component Value Date/Time   CHOL 182 08/10/2010 0448   TRIG 136 08/10/2010 0448   HDL 40 08/10/2010 0448   CHOLHDL 4.6 08/10/2010 0448   VLDL 27 08/10/2010 0448   LDLCALC (H) 08/10/2010 0448    115        Total Cholesterol/HDL:CHD Risk Coronary Heart Disease Risk Table                     Men   Women  1/2 Average Risk   3.4   3.3  Average Risk       5.0   4.4  2 X Average Risk   9.6   7.1  3 X Average Risk  23.4   11.0  Use the calculated Patient Ratio above and the CHD Risk Table to determine the patient's CHD Risk.        ATP III CLASSIFICATION (LDL):  <100     mg/dL   Optimal  100-129  mg/dL   Near or Above                    Optimal  130-159  mg/dL   Borderline  160-189  mg/dL   High  >190     mg/dL   Very High    Dated 08/09/19: cholesterol 155, triglycerides 92, HDL 41, LDL 97.  Wt Readings from Last 3 Encounters:  07/14/20 205 lb (93 kg)  07/16/19 205 lb (93 kg)  02/05/18 210 lb (95.3 kg)      Other studies Reviewed: Additional studies/ records that were reviewed today include: ***. Review of the above records demonstrates: ***   ASSESSMENT AND PLAN:  1.  ***   Current medicines are reviewed at length with the patient today.  The patient {ACTIONS; HAS/DOES NOT HAVE:19233} concerns regarding medicines.  The following changes have been made:  {PLAN; NO CHANGE:13088:s}  Labs/  tests ordered today include: *** No orders of the defined types were placed in this encounter.    Disposition:   FU with *** in {gen number 2-97:989211} {Days to years:10300}  Signed, Todd Humm Martinique, MD  07/24/2020 11:40 AM    [facility] [address] [place], [place], [address] Phone [phone], Fax [phone]

## 2020-07-27 ENCOUNTER — Ambulatory Visit: Payer: Medicare Other | Admitting: Cardiology

## 2020-08-07 DIAGNOSIS — Z9181 History of falling: Secondary | ICD-10-CM | POA: Diagnosis not present

## 2020-08-07 DIAGNOSIS — E785 Hyperlipidemia, unspecified: Secondary | ICD-10-CM | POA: Diagnosis not present

## 2020-08-07 DIAGNOSIS — Z0000 Encounter for general adult medical examination without abnormal findings: Secondary | ICD-10-CM | POA: Diagnosis not present

## 2020-08-20 DIAGNOSIS — Z79899 Other long term (current) drug therapy: Secondary | ICD-10-CM | POA: Diagnosis not present

## 2020-08-31 DIAGNOSIS — E876 Hypokalemia: Secondary | ICD-10-CM | POA: Diagnosis not present

## 2020-08-31 DIAGNOSIS — Z87891 Personal history of nicotine dependence: Secondary | ICD-10-CM | POA: Diagnosis not present

## 2020-08-31 DIAGNOSIS — Z1211 Encounter for screening for malignant neoplasm of colon: Secondary | ICD-10-CM | POA: Diagnosis not present

## 2020-08-31 DIAGNOSIS — K219 Gastro-esophageal reflux disease without esophagitis: Secondary | ICD-10-CM | POA: Diagnosis not present

## 2020-08-31 DIAGNOSIS — M159 Polyosteoarthritis, unspecified: Secondary | ICD-10-CM | POA: Diagnosis not present

## 2020-08-31 DIAGNOSIS — R600 Localized edema: Secondary | ICD-10-CM | POA: Diagnosis not present

## 2020-08-31 DIAGNOSIS — I10 Essential (primary) hypertension: Secondary | ICD-10-CM | POA: Diagnosis not present

## 2020-08-31 DIAGNOSIS — E785 Hyperlipidemia, unspecified: Secondary | ICD-10-CM | POA: Diagnosis not present

## 2020-09-07 DIAGNOSIS — K047 Periapical abscess without sinus: Secondary | ICD-10-CM | POA: Diagnosis not present

## 2020-09-07 DIAGNOSIS — I10 Essential (primary) hypertension: Secondary | ICD-10-CM | POA: Diagnosis not present

## 2020-09-17 DIAGNOSIS — Z79899 Other long term (current) drug therapy: Secondary | ICD-10-CM | POA: Diagnosis not present

## 2020-09-28 DIAGNOSIS — Z87891 Personal history of nicotine dependence: Secondary | ICD-10-CM | POA: Diagnosis not present

## 2020-09-28 DIAGNOSIS — E876 Hypokalemia: Secondary | ICD-10-CM | POA: Diagnosis not present

## 2020-09-28 DIAGNOSIS — E785 Hyperlipidemia, unspecified: Secondary | ICD-10-CM | POA: Diagnosis not present

## 2020-09-28 DIAGNOSIS — I10 Essential (primary) hypertension: Secondary | ICD-10-CM | POA: Diagnosis not present

## 2020-09-28 DIAGNOSIS — R600 Localized edema: Secondary | ICD-10-CM | POA: Diagnosis not present

## 2020-09-28 DIAGNOSIS — M159 Polyosteoarthritis, unspecified: Secondary | ICD-10-CM | POA: Diagnosis not present

## 2020-09-28 DIAGNOSIS — K219 Gastro-esophageal reflux disease without esophagitis: Secondary | ICD-10-CM | POA: Diagnosis not present

## 2020-09-29 ENCOUNTER — Ambulatory Visit (INDEPENDENT_AMBULATORY_CARE_PROVIDER_SITE_OTHER): Payer: Medicare Other | Admitting: Cardiovascular Disease

## 2020-09-29 ENCOUNTER — Other Ambulatory Visit: Payer: Self-pay

## 2020-09-29 ENCOUNTER — Encounter: Payer: Self-pay | Admitting: Cardiovascular Disease

## 2020-09-29 VITALS — BP 124/72 | HR 44 | Ht 70.0 in | Wt 193.0 lb

## 2020-09-29 DIAGNOSIS — R0989 Other specified symptoms and signs involving the circulatory and respiratory systems: Secondary | ICD-10-CM

## 2020-09-29 DIAGNOSIS — R0600 Dyspnea, unspecified: Secondary | ICD-10-CM

## 2020-09-29 DIAGNOSIS — I10 Essential (primary) hypertension: Secondary | ICD-10-CM

## 2020-09-29 DIAGNOSIS — R079 Chest pain, unspecified: Secondary | ICD-10-CM

## 2020-09-29 DIAGNOSIS — E785 Hyperlipidemia, unspecified: Secondary | ICD-10-CM

## 2020-09-29 DIAGNOSIS — R0609 Other forms of dyspnea: Secondary | ICD-10-CM | POA: Insufficient documentation

## 2020-09-29 NOTE — Assessment & Plan Note (Signed)
History of essential pretension blood pressure measured today at 124/72.  He is on amlodipine

## 2020-09-29 NOTE — Assessment & Plan Note (Signed)
History of hyperlipidemia on atorvastatin with lipid profile performed 08/01/2019 revealing total cholesterol 155, LDL of 97 and an HDL 41.

## 2020-09-29 NOTE — Assessment & Plan Note (Signed)
[name] has a long history of tobacco abuse having smoked 50 pack years and continue to smoke 1 pack a day.  He does have dyspnea on exertion.  I am going get a 2D echocardiogram to further evaluate

## 2020-09-29 NOTE — Progress Notes (Signed)
09/29/2020 [name]   Feb 28, 1956  [id number]  [facility] Primary Cardiologist: Lorretta Harp MD Lupe Carney, [place]  HPI:  [name] is a 65 y.o. mildly overweight engaged African-American male father of 108 children, grandfather of 5 grandchildren who is accompanied by his fiancs Kim today.  He was referred by the emergency room for cardiovascular valuation because of shortness of breath and atypical chest pain.  He moved down from [place] to [place] back in 1997.  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  His risk factors include 50 pack years of tobacco abuse continuing to smoke 1 pack/day recalcitrant to risk factor modification along with treated hypertension and hyperlipidemia.  His brother did have a stent Gwenlyn Found is never had a heart attack or stroke.  He does have GERD.  He was seen in the emergency room on 07/14/2020 with orthopnea and atypical chest pain.  His work-up was unrevealing.  Chest CTA showed no pulmonary embolism with scattered coronary calcification.  He was treated empirically with IV diuretic and actually felt better.   Current Meds  Medication Sig   amLODipine (NORVASC) 10 MG tablet Take 10 mg by mouth every morning.    aspirin EC 81 MG tablet Take 81 mg by mouth daily.   atorvastatin (LIPITOR) 10 MG tablet Take 10 mg by mouth every morning.   furosemide (LASIX) 20 MG tablet Take 1 tablet (20 mg total) by mouth daily.   POTASSIUM CHLORIDE PO Take 1 tablet by mouth daily.     Allergies  Allergen Reactions   Sulfa Antibiotics Itching    Social History   Socioeconomic History   Marital status: Single    Spouse name: Not on file   Number of children: Not on file   Years of education: Not on file   Highest education level: Not on file  Occupational History   Not on file  Tobacco Use   Smoking status: Every Day    Packs/day: 0.50    Years: 25.00    Pack years: 12.50    Types:  Cigarettes   Smokeless tobacco: Never  Vaping Use   Vaping Use: Never used  Substance and Sexual Activity   Alcohol use: No   Drug use: No   Sexual activity: Not on file  Other Topics Concern   Not on file  Social History Narrative   Not on file   Social Determinants of Health   Financial Resource Strain: Not on file  Food Insecurity: Not on file  Transportation Needs: Not on file  Physical Activity: Not on file  Stress: Not on file  Social Connections: Not on file  Intimate Partner Violence: Not on file     Review of Systems: General: negative for chills, fever, night sweats or weight changes.  Cardiovascular: negative for chest pain, dyspnea on exertion, edema, orthopnea, palpitations, paroxysmal nocturnal dyspnea or shortness of breath Dermatological: negative for rash Respiratory: negative for cough or wheezing Urologic: negative for hematuria Abdominal: negative for nausea, vomiting, diarrhea, bright red blood per rectum, melena, or hematemesis Neurologic: negative for visual changes, syncope, or dizziness All other systems reviewed and are otherwise negative except as noted above.    Blood pressure 124/72, pulse (!) 44, height 5\' 10"  (1.778 m), weight 193 lb (87.5 kg).  General appearance: alert and no distress Neck: no adenopathy, no carotid bruit, no JVD, supple, symmetrical, trachea midline, and thyroid not enlarged, symmetric, no tenderness/mass/nodules  Lungs: clear to auscultation bilaterally Heart: regular rate and rhythm, S1, S2 normal, no murmur, click, rub or gallop Extremities: extremities normal, atraumatic, no cyanosis or edema Pulses: 2+ and symmetric Skin: Skin color, texture, turgor normal. No rashes or lesions Neurologic: Grossly normal  EKG Marked sinus bradycardia 44 with anterolateral T wave inversion.  I personally reviewed this EKG.  ASSESSMENT AND PLAN:   Dyspnea on exertion Mr. Casillas has a long history of tobacco abuse having smoked 50  pack years and continue to smoke 1 pack a day.  He does have dyspnea on exertion.  I am going get a 2D echocardiogram to further evaluate  Essential hypertension History of essential pretension blood pressure measured today at 124/72.  He is on amlodipine  Hyperlipidemia History of hyperlipidemia on atorvastatin with lipid profile performed 08/01/2019 revealing total cholesterol 155, LDL of 97 and an HDL 41.     Lorretta Harp MD FACP,FACC,FAHA, [facility] 09/29/2020 3:42 PM

## 2020-09-29 NOTE — Patient Instructions (Signed)
Medication Instructions:  The current medical regimen is effective;  continue present plan and medications.  *If you need a refill on your cardiac medications before your next appointment, please call your pharmacy*  Testing/Procedures: Your physician has requested that you have a carotid duplex. This test is an ultrasound of the carotid arteries in your neck. It looks at blood flow through these arteries that supply the brain with blood. Allow one hour for this exam. There are no restrictions or special instructions.   CALCIUM SCORE   Echocardiogram - Your physician has requested that you have an echocardiogram. Echocardiography is a painless test that uses sound waves to create images of your heart. It provides your doctor with information about the size and shape of your heart and how well your heart's chambers and valves are working. This procedure takes approximately one hour. There are no restrictions for this procedure. This will be performed at our [facility] location - [address].   Follow-Up: At [facility], you and your health needs are our priority.  As part of our continuing mission to provide you with exceptional heart care, we have created designated Provider Care Teams.  These Care Teams include your primary Cardiologist (physician) and Advanced Practice Providers (APPs -  Physician Assistants and Nurse Practitioners) who all work together to provide you with the care you need, when you need it.  We recommend signing up for the patient portal called "MyChart".  Sign up information is provided on this After Visit Summary.  MyChart is used to connect with patients for Virtual Visits (Telemedicine).  Patients are able to view lab/test results, encounter notes, upcoming appointments, etc.  Non-urgent messages can be sent to your provider as well.   To learn more about what you can do with MyChart, go to [email or web address].    Your next appointment:   3  month(s)  The format for your next appointment:   In Person  Provider:   Quay Burow, MD

## 2020-10-15 DIAGNOSIS — Z79899 Other long term (current) drug therapy: Secondary | ICD-10-CM | POA: Diagnosis not present

## 2020-10-19 ENCOUNTER — Inpatient Hospital Stay: Admission: RE | Admit: 2020-10-19 | Payer: Self-pay | Source: Ambulatory Visit

## 2020-10-19 ENCOUNTER — Encounter (HOSPITAL_COMMUNITY): Payer: Self-pay | Admitting: Cardiovascular Disease

## 2020-10-19 ENCOUNTER — Ambulatory Visit (HOSPITAL_COMMUNITY): Payer: Medicare Other | Attending: Cardiology

## 2020-10-26 ENCOUNTER — Ambulatory Visit (HOSPITAL_COMMUNITY)
Admission: RE | Admit: 2020-10-26 | Payer: Medicare Other | Source: Ambulatory Visit | Attending: Cardiovascular Disease | Admitting: Cardiovascular Disease

## 2020-11-02 ENCOUNTER — Telehealth (HOSPITAL_COMMUNITY): Payer: Self-pay | Admitting: Cardiovascular Disease

## 2020-11-02 NOTE — Telephone Encounter (Signed)
Message routed to covering nurse as Juluis Rainier

## 2020-11-02 NOTE — Telephone Encounter (Signed)
Just an FYI. We have made several attempts to contact this patient including sending a letter to schedule or reschedule their echocardiogram. We will be removing the patient from the echo [place].  10/20/20 called and NVM is set up and unable to LM @ 3:01/[facility]  10/19/20 NO SHOWED-MAILED LETTER [facility]      Thank you

## 2020-11-12 DIAGNOSIS — Z79899 Other long term (current) drug therapy: Secondary | ICD-10-CM | POA: Diagnosis not present

## 2020-12-10 DIAGNOSIS — Z79899 Other long term (current) drug therapy: Secondary | ICD-10-CM | POA: Diagnosis not present

## 2021-01-06 ENCOUNTER — Ambulatory Visit (HOSPITAL_COMMUNITY)
Admission: RE | Admit: 2021-01-06 | Payer: Medicare Other | Source: Ambulatory Visit | Attending: Cardiovascular Disease | Admitting: Cardiovascular Disease

## 2021-01-06 ENCOUNTER — Ambulatory Visit: Payer: Medicare Other | Admitting: Cardiovascular Disease

## 2021-01-07 DIAGNOSIS — Z79899 Other long term (current) drug therapy: Secondary | ICD-10-CM | POA: Diagnosis not present

## 2021-02-03 DIAGNOSIS — Z79899 Other long term (current) drug therapy: Secondary | ICD-10-CM | POA: Diagnosis not present

## 2021-02-15 DIAGNOSIS — K219 Gastro-esophageal reflux disease without esophagitis: Secondary | ICD-10-CM | POA: Diagnosis not present

## 2021-02-15 DIAGNOSIS — E785 Hyperlipidemia, unspecified: Secondary | ICD-10-CM | POA: Diagnosis not present

## 2021-02-15 DIAGNOSIS — I10 Essential (primary) hypertension: Secondary | ICD-10-CM | POA: Diagnosis not present

## 2021-02-15 DIAGNOSIS — Z87891 Personal history of nicotine dependence: Secondary | ICD-10-CM | POA: Diagnosis not present

## 2021-02-15 DIAGNOSIS — E876 Hypokalemia: Secondary | ICD-10-CM | POA: Diagnosis not present

## 2021-02-15 DIAGNOSIS — R600 Localized edema: Secondary | ICD-10-CM | POA: Diagnosis not present

## 2021-02-15 DIAGNOSIS — M159 Polyosteoarthritis, unspecified: Secondary | ICD-10-CM | POA: Diagnosis not present

## 2021-03-03 DIAGNOSIS — Z79899 Other long term (current) drug therapy: Secondary | ICD-10-CM | POA: Diagnosis not present

## 2021-03-26 ENCOUNTER — Encounter (HOSPITAL_COMMUNITY): Payer: Self-pay

## 2021-03-31 DIAGNOSIS — Z79899 Other long term (current) drug therapy: Secondary | ICD-10-CM | POA: Diagnosis not present

## 2021-04-28 DIAGNOSIS — Z79899 Other long term (current) drug therapy: Secondary | ICD-10-CM | POA: Diagnosis not present

## 2021-05-12 ENCOUNTER — Encounter (HOSPITAL_COMMUNITY): Payer: Self-pay | Admitting: Emergency Medicine

## 2021-05-12 ENCOUNTER — Emergency Department (HOSPITAL_COMMUNITY): Payer: Medicare Other

## 2021-05-12 ENCOUNTER — Other Ambulatory Visit: Payer: Self-pay

## 2021-05-12 ENCOUNTER — Emergency Department (HOSPITAL_COMMUNITY)
Admission: EM | Admit: 2021-05-12 | Discharge: 2021-05-12 | Disposition: A | Discharge status: Home / Self Care | Payer: Medicare Other | Attending: Emergency Medicine | Admitting: Emergency Medicine

## 2021-05-12 DIAGNOSIS — D649 Anemia, unspecified: Secondary | ICD-10-CM | POA: Insufficient documentation

## 2021-05-12 DIAGNOSIS — E876 Hypokalemia: Secondary | ICD-10-CM | POA: Insufficient documentation

## 2021-05-12 DIAGNOSIS — R519 Headache, unspecified: Secondary | ICD-10-CM | POA: Insufficient documentation

## 2021-05-12 DIAGNOSIS — I10 Essential (primary) hypertension: Secondary | ICD-10-CM | POA: Insufficient documentation

## 2021-05-12 DIAGNOSIS — Z7982 Long term (current) use of aspirin: Secondary | ICD-10-CM | POA: Insufficient documentation

## 2021-05-12 DIAGNOSIS — R630 Anorexia: Secondary | ICD-10-CM | POA: Diagnosis not present

## 2021-05-12 DIAGNOSIS — Z79899 Other long term (current) drug therapy: Secondary | ICD-10-CM | POA: Insufficient documentation

## 2021-05-12 DIAGNOSIS — R0602 Shortness of breath: Secondary | ICD-10-CM

## 2021-05-12 DIAGNOSIS — R5383 Other fatigue: Secondary | ICD-10-CM | POA: Diagnosis not present

## 2021-05-12 DIAGNOSIS — R001 Bradycardia, unspecified: Secondary | ICD-10-CM | POA: Diagnosis not present

## 2021-05-12 DIAGNOSIS — Z20822 Contact with and (suspected) exposure to covid-19: Secondary | ICD-10-CM | POA: Diagnosis not present

## 2021-05-12 LAB — CBC WITH DIFFERENTIAL/PLATELET
Abs Immature Granulocytes: 0.01 10*3/uL (ref 0.00–0.07)
Basophils Absolute: 0 10*3/uL (ref 0.0–0.1)
Basophils Relative: 1 %
Eosinophils Absolute: 0.2 10*3/uL (ref 0.0–0.5)
Eosinophils Relative: 3 %
HCT: 37.7 % — ABNORMAL LOW (ref 39.0–52.0)
Hemoglobin: 12.5 g/dL — ABNORMAL LOW (ref 13.0–17.0)
Immature Granulocytes: 0 %
Lymphocytes Relative: 41 %
Lymphs Abs: 2.6 10*3/uL (ref 0.7–4.0)
MCH: 28.8 pg (ref 26.0–34.0)
MCHC: 33.2 g/dL (ref 30.0–36.0)
MCV: 86.9 fL (ref 80.0–100.0)
Monocytes Absolute: 0.6 10*3/uL (ref 0.1–1.0)
Monocytes Relative: 10 %
Neutro Abs: 2.9 10*3/uL (ref 1.7–7.7)
Neutrophils Relative %: 45 %
Platelets: 218 10*3/uL (ref 150–400)
RBC: 4.34 MIL/uL (ref 4.22–5.81)
RDW: 14.1 % (ref 11.5–15.5)
WBC: 6.4 10*3/uL (ref 4.0–10.5)
nRBC: 0 % (ref 0.0–0.2)

## 2021-05-12 LAB — COMPREHENSIVE METABOLIC PANEL
ALT: 15 U/L (ref 0–44)
AST: 27 U/L (ref 15–41)
Albumin: 4.4 g/dL (ref 3.5–5.0)
Alkaline Phosphatase: 41 U/L (ref 38–126)
Anion gap: 6 (ref 5–15)
BUN: 11 mg/dL (ref 8–23)
CO2: 28 mmol/L (ref 22–32)
Calcium: 8.9 mg/dL (ref 8.9–10.3)
Chloride: 104 mmol/L (ref 98–111)
Creatinine, Ser: 0.72 mg/dL (ref 0.61–1.24)
GFR, Estimated: >60 mL/min (ref >60)
Glucose, Bld: 83 mg/dL (ref 70–99)
Potassium: 3.3 mmol/L — ABNORMAL LOW (ref 3.5–5.1)
Sodium: 138 mmol/L (ref 135–145)
Total Bilirubin: 0.2 mg/dL — ABNORMAL LOW (ref 0.3–1.2)
Total Protein: 7.7 g/dL (ref 6.5–8.1)

## 2021-05-12 LAB — RESP PANEL BY RT-PCR (FLU A&B, COVID) ARPGX2
Influenza A by PCR: NEGATIVE
Influenza B by PCR: NEGATIVE
SARS Coronavirus 2 by RT PCR: NEGATIVE

## 2021-05-12 LAB — TSH: TSH: 0.784 u[IU]/mL (ref 0.350–4.500)

## 2021-05-12 LAB — BRAIN NATRIURETIC PEPTIDE: B Natriuretic Peptide: 98 pg/mL (ref 0.0–100.0)

## 2021-05-12 LAB — TROPONIN I (HIGH SENSITIVITY)
Troponin I (High Sensitivity): 15 ng/L (ref <18)
Troponin I (High Sensitivity): 13 ng/L (ref <18)

## 2021-05-12 MED ORDER — DIPHENHYDRAMINE HCL 50 MG/ML IJ SOLN: 50.0000 mg | Freq: Once | INTRAMUSCULAR | Status: DC

## 2021-05-12 MED ORDER — PROCHLORPERAZINE EDISYLATE 10 MG/2ML IJ SOLN: 10.0000 mg | Freq: Once | INTRAMUSCULAR | Status: DC

## 2021-05-12 MED ORDER — ACETAMINOPHEN 325 MG PO TABS
650.0000 mg | ORAL_TABLET | Freq: Once | ORAL | Status: AC
  Administered 2021-05-12: 650 mg via ORAL
  Filled 2021-05-12: qty 2

## 2021-05-12 NOTE — ED Provider Notes (Signed)
[facility] DEPT Provider Note   CSN: [id number] Arrival date & time: 05/12/21  1616     History  Chief Complaint  Patient presents with   Shortness of Breath    [name] is a 66 y.o. male with a history of hyperlipidemia, HTN, and persistent bradycardia presents today complaining of mild shortness of breath and decreased energy levels.  Current everyday tobacco use and admits to poor compliance of blood pressure medications.  Has not taken amlodipine or Lasix in the last month because he does not like how often he has to urinate.  Denies N/V, constipation, diarrhea, abdominal pain, or urinary symptoms.  Also complains of accompanying headache on the back right side of his head.  History of headaches, but patient states this one is different.  Denies lightheadedness, dizziness, vision changes, hearing changes, syncope, LOC, or other neuro deficits.  Denies chest pain, palpitations, or chest tightness.  Endorses mildly decreased appetite.  Denies recent illness or close contacts with sick persons.  Has not seen cardiologist since 09/2020.  Accompanied by fianc.  The history is provided by the patient and medical records.      Home Medications Prior to Admission medications   Medication Sig Start Date End Date Taking? Authorizing Provider  amLODipine (NORVASC) 10 MG tablet Take 10 mg by mouth every morning.     [provider]  aspirin EC 81 MG tablet Take 81 mg by mouth daily.    [provider]  atorvastatin (LIPITOR) 10 MG tablet Take 10 mg by mouth every morning.    [provider]  furosemide (LASIX) 20 MG tablet Take 1 tablet (20 mg total) by mouth daily. 07/15/20   Rancour, Annie Main, MD  POTASSIUM CHLORIDE PO Take 1 tablet by mouth daily.    [provider]      Allergies    Sulfa antibiotics    Review of Systems   Review of Systems  Constitutional:  Positive for fatigue.  Respiratory:  Positive for shortness of  breath.   Neurological:  Positive for headaches.   Physical Exam Updated Vital Signs BP 139/80    Pulse (!) 49    Temp 97.9 F (36.6 C) (Oral)    Resp 16    Ht 5\' 10"  (1.778 m)    Wt 88 kg    SpO2 100%    BMI 27.84 kg/m  Physical Exam Vitals and nursing note reviewed.  Constitutional:      General: He is not in acute distress.    Appearance: He is well-developed. He is not ill-appearing or diaphoretic.  HENT:     Head: Normocephalic and atraumatic.     Mouth/Throat:     Mouth: Mucous membranes are moist.     Pharynx: Oropharynx is clear.  Eyes:     General: No visual field deficit.    Extraocular Movements: Extraocular movements intact.     Conjunctiva/sclera: Conjunctivae normal.  Neck:     Trachea: No tracheal deviation.  Cardiovascular:     Rate and Rhythm: Regular rhythm. Bradycardia present.     Pulses: Normal pulses. No decreased pulses.          Radial pulses are 2+ on the right side and 2+ on the left side.       Dorsalis pedis pulses are 2+ on the right side and 2+ on the left side.     Heart sounds: Normal heart sounds. No murmur heard. Pulmonary:     Effort: Pulmonary effort  is normal. No tachypnea, accessory muscle usage or respiratory distress.     Breath sounds: Normal breath sounds. No wheezing or rhonchi.     Comments: SPO2 observed as 99% on room air Chest:     Chest wall: No deformity, tenderness or crepitus.  Abdominal:     General: Bowel sounds are normal.     Palpations: Abdomen is soft.     Tenderness: There is no abdominal tenderness.  Musculoskeletal:        General: No swelling. Normal range of motion.     Cervical back: Neck supple.     Right lower leg: No edema.     Left lower leg: No edema.  Skin:    General: Skin is warm and dry.     Capillary Refill: Capillary refill takes less than 2 seconds.  Neurological:     General: No focal deficit present.     Mental Status: He is alert and oriented to person, place, and time.     GCS: GCS eye  subscore is 4. GCS verbal subscore is 5. GCS motor subscore is 6.     Cranial Nerves: Cranial nerves 2-12 are intact. No cranial nerve deficit, dysarthria or facial asymmetry.     Sensory: Sensation is intact.     Motor: No weakness.  Psychiatric:        Mood and Affect: Mood normal.    ED Results / Procedures / Treatments   Labs (all labs ordered are listed, but only abnormal results are displayed) Labs Reviewed  COMPREHENSIVE METABOLIC PANEL - Abnormal; Notable for the following components:      Result Value   Potassium 3.3 (*)    Total Bilirubin 0.2 (*)    All other components within normal limits  CBC WITH DIFFERENTIAL/PLATELET - Abnormal; Notable for the following components:   Hemoglobin 12.5 (*)    HCT 37.7 (*)    All other components within normal limits  RESP PANEL BY RT-PCR (FLU A&B, COVID) ARPGX2  BRAIN NATRIURETIC PEPTIDE  TSH  TROPONIN I (HIGH SENSITIVITY)  TROPONIN I (HIGH SENSITIVITY)    EKG EKG Interpretation  Date/Time:  Wednesday May 12 2021 17:34:21 EST Ventricular Rate:  47 PR Interval:  209 QRS Duration: 124 QT Interval:  662 QTC Calculation: 586 R Axis:   40 Text Interpretation: Sinus bradycardia Nonspecific intraventricular conduction delay Abnrm T, consider ischemia, anterolateral lds bigeminy resolved since prior tracing Confirmed by Blanchie Dessert [phone]) on 05/12/2021 5:44:33 PM  Radiology DG Chest 2 View  Result Date: 05/12/2021 CLINICAL DATA:  Shortness of breath. EXAM: CHEST - 2 VIEW COMPARISON:  Jul 14, 2020 FINDINGS: Tortuosity of the aorta. Cardiomediastinal silhouette is normal. Mediastinal contours appear intact. There is no evidence of focal airspace consolidation, pleural effusion or pneumothorax. Osseous structures are without acute abnormality. Soft tissues are grossly normal. IMPRESSION: No active cardiopulmonary disease. Tortuosity of the aorta. Electronically Signed   By: Fidela Salisbury M.D.   On: 05/12/2021 17:15     Procedures Procedures    Medications Ordered in ED Medications  prochlorperazine (COMPAZINE) injection 10 mg (10 mg Intramuscular Patient Refused/Not Given 05/12/21 2045)  diphenhydrAMINE (BENADRYL) injection 50 mg (50 mg Intramuscular Patient Refused/Not Given 05/12/21 2045)  acetaminophen (TYLENOL) tablet 650 mg (650 mg Oral Given 05/12/21 2048)    ED Course/ Medical Decision Making/ A&P                           Medical Decision  Making Amount and/or Complexity of Data Reviewed Labs: ordered. Radiology: ordered.   66 y.o. male presents to the ED for concern of Shortness of Breath  .  This involves an extensive number of treatment options, and is a complaint that carries with it a high risk of complications and morbidity.  The differential diagnosis includes acute pulmonary pathology, anemia, acute coronary syndrome  Comorbidities that complicate the patient evaluation include hyperlipidemia, HTN, persistent bradycardia  Additional history obtained from internal/external records available via epic  Interpretation: I ordered, and personally interpreted labs.  The pertinent results include: Negative COVID and influenza.  Negative troponins.  Electrolytes unremarkable with the exception of potassium of 3.3, which is mildly decreased.  LFTs and WBC values unremarkable.  Mild anemia noted with a hemoglobin of 12.5 and a hematocrit of 37.7.  BNP observed at 98, which is not suggestive of CHF.  TSH within normal range.  I ordered imaging studies including chest x-ray.  I independently visualized and interpreted imaging which showed no acute cardiopulmonary process or pathology with the exception of a tortuous aorta.  I agree with the radiologist interpretation  I ordered and independently visualized and interpreted the EKG, which showed sinus bradycardic rhythm with prolonged QT, abnormal T waves that may indicate ischemia, and potential LVH.  This looks similar or possibly slightly improved  to previous EKGs.  Intervention: I ordered medication including Tylenol, Benadryl, Compazine as part of the migraine cocktail for his head pain.  Patient agreed to the Tylenol, but refused the Benadryl and Compazine stating he did not want them.  Reevaluation of the patient after these medicines showed that the patient improved significantly.  I have reviewed the patients home medicines and have made adjustments as needed  The patient was maintained on a cardiac monitor.  I personally viewed and interpreted the cardiac monitored which showed an underlying rhythm of: Sinus bradycardia  ED Course: Considered stroke, based on presentation and history this is unlikely.  Consider congestive heart failure or an arrhythmia, but no physical exam or lab evidence of CHF and no arrhythmia other than sinus bradycardia noted on EKG, which is consistent with previous EKGs.  Considered hypothyroidism or thyroid dysfunction, but labs and physical exam not supportive of this.  Considered acute coronary syndrome, but history, labs, and physical exam not supportive of this.  Patient has mild anemia as indicated in his labs.  This in combination of the bradycardia or alone may be contributory to his fatigue.  Discussed this with the patient and his fiance, whom understand this.  Encouraged follow-up with primary care to further evaluate the mild anemia.  Also encouraged to follow-up with cardiology for reevaluation and further medical management.  Also encouraged adherence to his medication regimen for his hypertension, as he has not been taking his amlodipine and Lasix over the last month.  No evidence of fluid overload on physical exam or imaging or labs.  No evidence of acute pulmonary pathology or infection on physical exam or imaging.  Patient feels remarkably better after receiving the Tylenol, wanted to be discharged to go home as he no longer feels fatigued.  Emergency department workup does not suggest an emergent  condition requiring admission or immediate intervention beyond  what has been performed at this time. The patient is safe for discharge and has been instructed to return immediately for worsening symptoms, change in symptoms or any other concerns  Social Determinants of Health include medicare, elderly population  Disposition: I discussed the patient and  their case with my attending, Dr. Maryan Rued, who agreed with the proposed treatment course.  After consideration of the diagnostic results and the patient's response to treatment, I feel that the patent would benefit from outpatient follow-up with primary care and cardiology, and adherence to his medication regimen as prescribed.  Discussed course of treatment thoroughly with the patient and fiance whom demonstrated understanding.  Patient in agreement and has no further questions.        Final Clinical Impression(s) / ED Diagnoses Final diagnoses:  SOB (shortness of breath)  Other fatigue    Rx / DC Orders ED Discharge Orders     None         Candace Cruise 37/94/44 2124    Blanchie Dessert, MD 05/13/21 1624

## 2021-05-12 NOTE — ED Triage Notes (Signed)
Patient reports feeling SOB with low energy levels for the past 2 days. Reports dyspnea w/ tying shoes or climbing up the steps. Denies LE edema,  ?

## 2021-05-12 NOTE — ED Provider Triage Note (Signed)
Emergency Medicine Provider Triage Evaluation Note ? ?[name] , a 66 y.o. male  was evaluated in triage.  Pt complains of low energy and shortness of breath for two days.  No pain.  He stopped taking his medications about 3-4 weeks ago because he was urinating a lot.  He restarted them today.  He feels like his potassium is low.  ? ?He has smoked a pack a day since he was 17 about.  ? ?Physical Exam  ?BP (!) 144/88 (BP Location: Right Arm)   Pulse (!) 57   Temp 97.9 ?F (36.6 ?C) (Oral)   Resp 18   Ht 5\' 10"  (1.778 m)   Wt 88 kg   SpO2 100%   BMI 27.84 kg/m?  ?Gen:   Awake, no distress   ?Resp:  Normal effort, mild rhonchi ?MSK:   Moves extremities without difficulty  ?Other:  Normal speech.  ? ?Medical Decision Making  ?Medically screening exam initiated at 4:37 PM.  Appropriate orders placed.  [name] was informed that the remainder of the evaluation will be completed by another provider, this initial triage assessment does not replace that evaluation, and the importance of remaining in the ED until their evaluation is complete. ? ? ?  ?Lorin Glass, PA-C ?05/12/21 1641 ? ?

## 2021-05-12 NOTE — Discharge Instructions (Addendum)
Please call and schedule a follow-up appointment with your primary care within the next 3 to 4 days for reevaluation ? ?Please also call and schedule an appointment to follow-up with the cardiology office that you have been seeing over the last few years, for reevaluation and continued diagnostic work-up. ? ?Continue to take your blood pressure medications as prescribed, as we discussed here today. ? ?Return to the ED for new or worsening symptoms as discussed. ?

## 2021-06-01 DIAGNOSIS — Z87891 Personal history of nicotine dependence: Secondary | ICD-10-CM | POA: Diagnosis not present

## 2021-06-01 DIAGNOSIS — E876 Hypokalemia: Secondary | ICD-10-CM | POA: Diagnosis not present

## 2021-06-01 DIAGNOSIS — R600 Localized edema: Secondary | ICD-10-CM | POA: Diagnosis not present

## 2021-06-01 DIAGNOSIS — I10 Essential (primary) hypertension: Secondary | ICD-10-CM | POA: Diagnosis not present

## 2021-06-01 DIAGNOSIS — M159 Polyosteoarthritis, unspecified: Secondary | ICD-10-CM | POA: Diagnosis not present

## 2021-06-01 DIAGNOSIS — E785 Hyperlipidemia, unspecified: Secondary | ICD-10-CM | POA: Diagnosis not present

## 2021-06-01 DIAGNOSIS — K219 Gastro-esophageal reflux disease without esophagitis: Secondary | ICD-10-CM | POA: Diagnosis not present

## 2021-07-13 DIAGNOSIS — Z79899 Other long term (current) drug therapy: Secondary | ICD-10-CM | POA: Diagnosis not present

## 2021-07-14 DIAGNOSIS — E785 Hyperlipidemia, unspecified: Secondary | ICD-10-CM | POA: Diagnosis not present

## 2021-07-14 DIAGNOSIS — E876 Hypokalemia: Secondary | ICD-10-CM | POA: Diagnosis not present

## 2021-07-14 DIAGNOSIS — K219 Gastro-esophageal reflux disease without esophagitis: Secondary | ICD-10-CM | POA: Diagnosis not present

## 2021-07-14 DIAGNOSIS — I10 Essential (primary) hypertension: Secondary | ICD-10-CM | POA: Diagnosis not present

## 2021-07-14 DIAGNOSIS — M159 Polyosteoarthritis, unspecified: Secondary | ICD-10-CM | POA: Diagnosis not present

## 2021-07-14 DIAGNOSIS — Z1211 Encounter for screening for malignant neoplasm of colon: Secondary | ICD-10-CM | POA: Diagnosis not present

## 2021-07-14 DIAGNOSIS — R600 Localized edema: Secondary | ICD-10-CM | POA: Diagnosis not present

## 2021-07-14 DIAGNOSIS — Z87891 Personal history of nicotine dependence: Secondary | ICD-10-CM | POA: Diagnosis not present

## 2021-07-14 DIAGNOSIS — J0101 Acute recurrent maxillary sinusitis: Secondary | ICD-10-CM | POA: Diagnosis not present

## 2021-07-29 DIAGNOSIS — K047 Periapical abscess without sinus: Secondary | ICD-10-CM | POA: Diagnosis not present

## 2021-07-29 DIAGNOSIS — I10 Essential (primary) hypertension: Secondary | ICD-10-CM | POA: Diagnosis not present

## 2021-07-29 DIAGNOSIS — Z87891 Personal history of nicotine dependence: Secondary | ICD-10-CM | POA: Diagnosis not present

## 2021-07-29 DIAGNOSIS — K219 Gastro-esophageal reflux disease without esophagitis: Secondary | ICD-10-CM | POA: Diagnosis not present

## 2021-07-29 DIAGNOSIS — M159 Polyosteoarthritis, unspecified: Secondary | ICD-10-CM | POA: Diagnosis not present

## 2021-07-29 DIAGNOSIS — E876 Hypokalemia: Secondary | ICD-10-CM | POA: Diagnosis not present

## 2021-07-29 DIAGNOSIS — E785 Hyperlipidemia, unspecified: Secondary | ICD-10-CM | POA: Diagnosis not present

## 2021-07-29 DIAGNOSIS — R600 Localized edema: Secondary | ICD-10-CM | POA: Diagnosis not present

## 2021-07-31 ENCOUNTER — Other Ambulatory Visit: Payer: Self-pay

## 2021-07-31 ENCOUNTER — Emergency Department (HOSPITAL_COMMUNITY)
Admission: EM | Admit: 2021-07-31 | Discharge: 2021-07-31 | Disposition: A | Discharge status: Home / Self Care | Payer: Medicare Other | Attending: Emergency Medicine | Admitting: Emergency Medicine

## 2021-07-31 DIAGNOSIS — Z7982 Long term (current) use of aspirin: Secondary | ICD-10-CM | POA: Diagnosis not present

## 2021-07-31 DIAGNOSIS — K047 Periapical abscess without sinus: Secondary | ICD-10-CM | POA: Diagnosis not present

## 2021-07-31 DIAGNOSIS — K0889 Other specified disorders of teeth and supporting structures: Secondary | ICD-10-CM | POA: Diagnosis present

## 2021-07-31 MED ORDER — AMOXICILLIN-POT CLAVULANATE 875-125 MG PO TABS: 1.0000 | ORAL_TABLET | Freq: Two times a day (BID) | ORAL | 0 refills | Status: DC

## 2021-07-31 NOTE — Discharge Instructions (Addendum)
Take Augmentin twice daily for the next 7 days.  Call the dentist on Monday to establish an appointment, information provided above if he are unable to see your current dentist.  He can continue taking Tylenol and ibuprofen for pain and for swelling.  Return to the ED if you take any fevers, inability to swallow, worsening swelling to the jaw.

## 2021-07-31 NOTE — ED Triage Notes (Signed)
Patient reports dental pain, abscess bottom right of mouth, visible swelling to right jaw area. Pain rated 7/10. Started last night, worsening today. Throbbing pain

## 2021-07-31 NOTE — ED Provider Notes (Signed)
[place] DEPT Provider Note   CSN: [id number] Arrival date & time: 07/31/21  1119     History  Chief Complaint  Patient presents with   Dental Pain    [name] is a 66 y.o. male.   Dental Pain  Patient presents due to dental abscess x3 days.  Started on the right lower side of his mouth, the pain has been going on for 2 to 3 days but worsened last night.  Denies any purulent material, no difficulty swallowing.  Has not had any fevers or chills at home, no recent known sick exposures.  He has tried Tylenol and ibuprofen with some relief of symptoms.  States she had this before and amoxicillin usually helps.  He is working towards getting a dental appointment next week but has not been able to establish that should day as it is Saturday.  The pain itself feels like a throbbing pain.  Home Medications Prior to Admission medications   Medication Sig Start Date End Date Taking? Authorizing Provider  amLODipine (NORVASC) 10 MG tablet Take 10 mg by mouth every morning.     [provider]  aspirin EC 81 MG tablet Take 81 mg by mouth daily.    [provider]  atorvastatin (LIPITOR) 10 MG tablet Take 10 mg by mouth every morning.    [provider]  furosemide (LASIX) 20 MG tablet Take 1 tablet (20 mg total) by mouth daily. 07/15/20   Rancour, Annie Main, MD  POTASSIUM CHLORIDE PO Take 1 tablet by mouth daily.    [provider]      Allergies    Sulfa antibiotics    Review of Systems   Review of Systems  Physical Exam Updated Vital Signs BP (!) 148/82 (BP Location: Left Arm)   Pulse (!) 54   Temp 98.6 F (37 C) (Oral)   Resp 17   Ht '5\' 11"'$  (1.803 m)   Wt 83.9 kg   SpO2 97%   BMI 25.80 kg/m  Physical Exam Vitals and nursing note reviewed. Exam conducted with a chaperone present.  Constitutional:      General: He is not in acute distress.    Appearance: Normal appearance.  HENT:     Head: Normocephalic  and atraumatic.     Mouth/Throat:     Mouth: Mucous membranes are moist.     Pharynx: No oropharyngeal exudate or posterior oropharyngeal erythema.     Comments: No sublingual tenderness or swelling. Uvula is midline. No trismus. No dry sockets or pulp exposure. Handling secretions without difficulty.  Dental abscess to right lower jaw Eyes:     General: No scleral icterus.    Extraocular Movements: Extraocular movements intact.     Pupils: Pupils are equal, round, and reactive to light.  Cardiovascular:     Rate and Rhythm: Normal rate and regular rhythm.  Pulmonary:     Effort: Pulmonary effort is normal.     Breath sounds: Normal breath sounds.  Musculoskeletal:     Cervical back: Normal range of motion. No rigidity or tenderness.  Skin:    Coloration: Skin is not jaundiced.  Neurological:     Mental Status: He is alert. Mental status is at baseline.     Coordination: Coordination normal.  Psychiatric:        Mood and Affect: Mood normal.    ED Results / Procedures / Treatments   Labs (all labs ordered are listed, but only abnormal results are displayed) Labs  Reviewed - No data to display  EKG None  Radiology No results found.  Procedures Procedures    Medications Ordered in ED Medications - No data to display  ED Course/ Medical Decision Making/ A&P                           Medical Decision Making  Patient presents due to dental abscess.  Differential diagnosis includes dental abscess, Ludewig angina, PTA, retropharyngeal abscess.  Patient is handling secretions, no trismus.  Uvula is midline, no tonsillar exudate or edema.  Patient does have right lower jaw abscess, slightly fluctuant.  Will start on Augmentin and have him follow-up with dentist next week as planned.  We discussed tricked return precautions including fevers, difficulty swallowing, swelling of the lower jaw.  He verbalized understanding to the plan and was discharged in stable condition.  Do not  feel additional work-up or hospitalization is warranted at this time.        Final Clinical Impression(s) / ED Diagnoses Final diagnoses:  None    Rx / DC Orders ED Discharge Orders     None         Sherrill Raring, Hershal Coria 07/31/21 1154    Davonna Belling, MD 07/31/21 1521

## 2021-09-07 DIAGNOSIS — Z79899 Other long term (current) drug therapy: Secondary | ICD-10-CM | POA: Diagnosis not present

## 2021-09-27 DIAGNOSIS — Z0000 Encounter for general adult medical examination without abnormal findings: Secondary | ICD-10-CM | POA: Diagnosis not present

## 2021-09-27 DIAGNOSIS — Z87891 Personal history of nicotine dependence: Secondary | ICD-10-CM | POA: Diagnosis not present

## 2021-10-07 ENCOUNTER — Encounter (HOSPITAL_COMMUNITY): Payer: Self-pay

## 2021-10-07 ENCOUNTER — Other Ambulatory Visit: Payer: Self-pay

## 2021-10-07 ENCOUNTER — Emergency Department (HOSPITAL_COMMUNITY)
Admission: EM | Admit: 2021-10-07 | Discharge: 2021-10-07 | Disposition: A | Discharge status: Home / Self Care | Payer: Medicare Other | Attending: Emergency Medicine | Admitting: Emergency Medicine

## 2021-10-07 ENCOUNTER — Emergency Department (HOSPITAL_COMMUNITY): Payer: Medicare Other

## 2021-10-07 DIAGNOSIS — Y92410 Unspecified street and highway as the place of occurrence of the external cause: Secondary | ICD-10-CM | POA: Insufficient documentation

## 2021-10-07 DIAGNOSIS — M545 Low back pain, unspecified: Secondary | ICD-10-CM | POA: Diagnosis not present

## 2021-10-07 DIAGNOSIS — Z7982 Long term (current) use of aspirin: Secondary | ICD-10-CM | POA: Insufficient documentation

## 2021-10-07 DIAGNOSIS — M5136 Other intervertebral disc degeneration, lumbar region: Secondary | ICD-10-CM | POA: Diagnosis not present

## 2021-10-07 MED ORDER — METHOCARBAMOL 500 MG PO TABS: 500.0000 mg | ORAL_TABLET | Freq: Two times a day (BID) | ORAL | 0 refills | Status: AC

## 2021-10-07 MED ORDER — NAPROXEN 375 MG PO TABS: 375.0000 mg | ORAL_TABLET | Freq: Two times a day (BID) | ORAL | 0 refills | Status: AC
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
## 2021-11-30 DIAGNOSIS — Z79899 Other long term (current) drug therapy: Secondary | ICD-10-CM | POA: Diagnosis not present

## 2022-03-02 NOTE — Pre-Procedure Instructions (Signed)
Surgical Instructions    Your procedure is scheduled on Tuesday, January 2nd.  Report to [facility] Main Entrance "A" at 05:30 A.M., then check in with the Admitting office.  Call this number if you have problems the morning of surgery:  [phone]   If you have any questions prior to your surgery date call [phone]: Open Monday-Friday 8am-4pm    Remember:  Do not eat after midnight the night before your surgery  You may drink clear liquids until 04:30 AM the morning of your surgery.   Clear liquids allowed are: Water, Non-Citrus Juices (without pulp), Carbonated Beverages, Clear Tea, Black Coffee Only (NO MILK, CREAM OR POWDERED CREAMER of any kind), and Gatorade.    Take these medicines the morning of surgery with A SIP OF WATER  amLODipine (NORVASC)  atorvastatin (LIPITOR)   Stop taking Buprenorphine HCl-Naloxone HCl 3 days prior to surgery.  Follow your surgeon's instructions on when to stop Aspirin.  If no instructions were given by your surgeon then you will need to call the office to get those instructions.     As of today, STOP taking any Aleve, Naproxen, Ibuprofen, Motrin, Advil, Goody's, BC's, all herbal medications, fish oil, and all vitamins.                     Do NOT Smoke (Tobacco/Vaping) for 24 hours prior to your procedure.  If you use a CPAP at night, you may bring your mask/headgear for your overnight stay.   Contacts, glasses, piercing's, hearing aid's, dentures or partials may not be worn into surgery, please bring cases for these belongings.    For patients admitted to the hospital, discharge time will be determined by your treatment team.   Patients discharged the day of surgery will not be allowed to drive home, and someone needs to stay with them for 24 hours.  SURGICAL WAITING ROOM VISITATION Patients having surgery or a procedure may have no more than 2 support people in the waiting area - these visitors may rotate.   Children under the age of  59 must have an adult with them who is not the patient. If the patient needs to stay at the hospital during part of their recovery, the visitor guidelines for inpatient rooms apply. Pre-op nurse will coordinate an appropriate time for 1 support person to accompany patient in pre-op.  This support person may not rotate.   Please refer to the [facility] website for the visitor guidelines for Inpatients (after your surgery is over and you are in a regular room).    Special instructions:   - Preparing For Surgery  Before surgery, you can play an important role. Because skin is not sterile, your skin needs to be as free of germs as possible. You can reduce the number of germs on your skin by washing with CHG (chlorahexidine gluconate) Soap before surgery.  CHG is an antiseptic cleaner which kills germs and bonds with the skin to continue killing germs even after washing.    Oral Hygiene is also important to reduce your risk of infection.  Remember - BRUSH YOUR TEETH THE MORNING OF SURGERY WITH YOUR REGULAR TOOTHPASTE  Please do not use if you have an allergy to CHG or antibacterial soaps. If your skin becomes reddened/irritated stop using the CHG.  Do not shave (including legs and underarms) for at least 48 hours prior to first CHG shower. It is OK to shave your face.  Please follow these instructions carefully.  Shower the NIGHT BEFORE SURGERY and the MORNING OF SURGERY  If you chose to wash your hair, wash your hair first as usual with your normal shampoo.  After you shampoo, rinse your hair and body thoroughly to remove the shampoo.  Use CHG Soap as you would any other liquid soap. You can apply CHG directly to the skin and wash gently with a scrungie or a clean washcloth.   Apply the CHG Soap to your body ONLY FROM THE NECK DOWN.  Do not use on open wounds or open sores. Avoid contact with your eyes, ears, mouth and genitals (private parts). Wash Face and genitals (private  parts)  with your normal soap.   Wash thoroughly, paying special attention to the area where your surgery will be performed.  Thoroughly rinse your body with warm water from the neck down.  DO NOT shower/wash with your normal soap after using and rinsing off the CHG Soap.  Pat yourself dry with a CLEAN TOWEL.  Wear CLEAN PAJAMAS to bed the night before surgery  Place CLEAN SHEETS on your bed the night before your surgery  DO NOT SLEEP WITH PETS.   Day of Surgery: Take a shower with CHG soap. Do not wear jewelry Do not wear lotions, powders, colognes, or deodorant. Men may shave face and neck. Do not bring valuables to the hospital. [facility] is not responsible for any belongings or valuables.  Wear Clean/Comfortable clothing the morning of surgery Remember to brush your teeth WITH YOUR REGULAR TOOTHPASTE.   Please read over the following fact sheets that you were given.    If you received a COVID test during your pre-op visit  it is requested that you wear a mask when out in public, stay away from anyone that may not be feeling well and notify your surgeon if you develop symptoms. If you have been in contact with anyone that has tested positive in the last 10 days please notify you surgeon.

## 2022-03-03 ENCOUNTER — Other Ambulatory Visit: Payer: Self-pay

## 2022-03-03 ENCOUNTER — Encounter (HOSPITAL_COMMUNITY): Payer: Self-pay | Admitting: Physician Assistant

## 2022-03-03 ENCOUNTER — Other Ambulatory Visit: Payer: Self-pay | Admitting: Otolaryngology

## 2022-03-03 ENCOUNTER — Encounter (HOSPITAL_COMMUNITY)
Admission: RE | Admit: 2022-03-03 | Discharge: 2022-03-03 | Disposition: A | Discharge status: Home / Self Care | Payer: Medicare Other | Source: Ambulatory Visit | Attending: Neurological Surgery | Admitting: Neurological Surgery

## 2022-03-03 ENCOUNTER — Encounter (HOSPITAL_COMMUNITY): Payer: Self-pay

## 2022-03-03 VITALS — BP 144/86 | HR 50 | Temp 97.6°F | Resp 18 | Ht 71.0 in | Wt 192.4 lb

## 2022-03-03 DIAGNOSIS — I10 Essential (primary) hypertension: Secondary | ICD-10-CM | POA: Diagnosis not present

## 2022-03-03 DIAGNOSIS — Z01812 Encounter for preprocedural laboratory examination: Secondary | ICD-10-CM | POA: Diagnosis present

## 2022-03-03 DIAGNOSIS — Z01818 Encounter for other preprocedural examination: Secondary | ICD-10-CM

## 2022-03-03 HISTORY — DX: Neoplasm of unspecified behavior of endocrine glands and other parts of nervous system: D49.7

## 2022-03-03 HISTORY — DX: Cardiac murmur, unspecified: R01.1

## 2022-03-03 LAB — COMPREHENSIVE METABOLIC PANEL
ALT: 13 U/L (ref 0–44)
AST: 25 U/L (ref 15–41)
Albumin: 4.2 g/dL (ref 3.5–5.0)
Alkaline Phosphatase: 45 U/L (ref 38–126)
Anion gap: 6 (ref 5–15)
BUN: 15 mg/dL (ref 8–23)
CO2: 25 mmol/L (ref 22–32)
Calcium: 9.1 mg/dL (ref 8.9–10.3)
Chloride: 105 mmol/L (ref 98–111)
Creatinine, Ser: 0.94 mg/dL (ref 0.61–1.24)
GFR, Estimated: >60 mL/min (ref >60)
Glucose, Bld: 93 mg/dL (ref 70–99)
Potassium: 3.1 mmol/L — ABNORMAL LOW (ref 3.5–5.1)
Sodium: 136 mmol/L (ref 135–145)
Total Bilirubin: 0.4 mg/dL (ref 0.3–1.2)
Total Protein: 7 g/dL (ref 6.5–8.1)

## 2022-03-03 LAB — CBC
HCT: 34.6 % — ABNORMAL LOW (ref 39.0–52.0)
Hemoglobin: 11.7 g/dL — ABNORMAL LOW (ref 13.0–17.0)
MCH: 28.9 pg (ref 26.0–34.0)
MCHC: 33.8 g/dL (ref 30.0–36.0)
MCV: 85.4 fL (ref 80.0–100.0)
Platelets: 243 10*3/uL (ref 150–400)
RBC: 4.05 MIL/uL — ABNORMAL LOW (ref 4.22–5.81)
RDW: 14 % (ref 11.5–15.5)
WBC: 6.1 10*3/uL (ref 4.0–10.5)
nRBC: 0 % (ref 0.0–0.2)

## 2022-03-03 NOTE — Progress Notes (Addendum)
PCP - Dr. Megan Salon at [facility] - dr. Roderic Palau berry  PPM/ICD - denies   Chest x-ray - n/a EKG - 05/12/21 Stress Test - denies ECHO - denies Cardiac Cath - denies  Sleep Study - denies    Follow your surgeon's instructions on when to stop Aspirin.  If no instructions were given by your surgeon then you will need to call the office to get those instructions.     ERAS Protcol -yes PRE-SURGERY Ensure or G2- not ordered  COVID TEST- not needed   Anesthesia review: yes, needs cardiology clearance. Also states he has a "bad tooth". " Needs to have it pulled". Does not have a dentist. Also states he has a heart murmur. I provided him with Dr. Zada Finders and Dr. Rachel Bo phone numbers to the office so he can get an appt/let them know about tooth pain.   Patient denies shortness of breath, fever, cough and chest pain at PAT appointment   All instructions explained to the patient, with a verbal understanding of the material. Patient agrees to go over the instructions while at home for a better understanding. Patient also instructed to self quarantine after being tested for COVID-19. The opportunity to ask questions was provided.

## 2022-03-04 ENCOUNTER — Encounter (HOSPITAL_COMMUNITY): Payer: Self-pay

## 2022-03-04 NOTE — Progress Notes (Signed)
Anesthesia Chart Review:  Case: [id number] Date/Time: 03/15/22 0715   Procedures:      Endonasal endoscopic pituitary tumor  resection - RM 21     ENDOSCOPIC TRANSNASAL APPROACH EXPOSURE WITH FUSION     POSSIBLE NASAL SEPTOPLASTY WITH TURBINATE REDUCTION, POSSIBLE NASAL SEPTAL FLAP, POSSIBLE FREE MUCOSAL GRAFT   Anesthesia type: General   Pre-op diagnosis: Pituitary adenoma   Location: [facility] OR ROOM 21 / [facility] OR   Surgeons: Judith Part, MD; Jenetta Downer, MD       DISCUSSION: Patient is a 66 year old male scheduled for the above procedure.  History includes smoking, HTN, bradycardia, murmur, arthritis, hernia repair, pituitary tumor. He on in buprenorphine HCL-naloxone (for previous opiate addiction by notes).   He had cardiology evaluation both Dr. Gwenlyn Found on 09/29/20 following an ED visit for SOB and atypical chest pain. Echocardiogram was ordered by never done. He also had a [place] ED visit for dyspnea on 05/12/21 with PCP and cardiology follow-up recommended. March EKG showed anterolateral T wave inversion. + Smoker.  He will require preoperative cardiology evaluation prior to elective surgery. Of note, he also reported a "bad tooth that needs to be pulled." This was all relayed to [facility] at Dr. Colleen Can office.   He has a cardiology visit on 03/08/22 with Coletta Memos, NP. Chart will be left for follow-up.    ADDENDUM 03/09/22 1:34 PM:  He missed his 03/08/22 cardiology visit and is now rescheduled for 03/24/22. Surgery to be postponed. He also says he is trying to get his tooth pulled prior to surgery.    VS: BP (!) 144/86   Pulse (!) 50   Temp 36.4 C (Oral)   Resp 18   Ht '5\' 11"'$  (1.803 m)   Wt 87.3 kg   SpO2 100%   BMI 26.83 kg/m    PROVIDERS: Jenean Lindau, MD is PCP ([facility]) Quay Burow, MD is cardiologist - He has a pending endocrinology evaluation given pituitary adenoma.   LABS: Labs reviewed: Acceptable for surgery. TSH  0.784 on 05/12/21. (all labs ordered are listed, but only abnormal results are displayed)  Labs Reviewed  CBC - Abnormal; Notable for the following components:      Result Value   RBC 4.05 (*)    Hemoglobin 11.7 (*)    HCT 34.6 (*)    All other components within normal limits  COMPREHENSIVE METABOLIC PANEL - Abnormal; Notable for the following components:   Potassium 3.1 (*)    All other components within normal limits     IMAGES: CT Paranasal Sinuses 02/14/22 ([facility]): IMPRESSION:  1. Preoperative surgical planning CT. Large pituitary tumor was  better characterized on prior MRI with remodeling and thinning of  the sella.  2. Mild inferior right maxillary sinus mucosal thickening.  Otherwise, clear sinuses.   MRI Head 11/21/21: Images can be viewed in [facility] Everywhere.  CXR 05/12/21: FINDINGS: - Tortuosity of the aorta. - Cardiomediastinal silhouette is normal. Mediastinal contours appear intact. - There is no evidence of focal airspace consolidation, pleural effusion or pneumothorax. - Osseous structures are without acute abnormality. Soft tissues are grossly normal. IMPRESSION: No active cardiopulmonary disease. Tortuosity of the aorta.  CTA Chest 07/15/20: ADDENDUM: Typographic air in the initial impression.  Section should read: 1. NO thoracic aortic aneurysm, aortic dissection or acute aortic syndrome is identified. 2. Minimal atelectatic changes, accentuated by imaging during exhalation. 3. Few secretions in the central airways, lungs otherwise clear. 4. Cardiomegaly.  Coronary artery atherosclerosis.  EKG: 05/12/21: Sinus bradycardia at 47 bpm Nonspecific intraventricular conduction delay Abnrm T, consider ischemia, anterolateral lds bigeminy resolved since prior tracing Confirmed by Blanchie Dessert [phone]) on 05/12/2021 5:44:33 PM   CV: N/A  Past Medical History:  Diagnosis Date   Arthritis    Bradycardia    Heart murmur    Hypertension     Pituitary tumor     Past Surgical History:  Procedure Laterality Date   HERNIA REPAIR      MEDICATIONS:  amLODipine (NORVASC) 10 MG tablet   amoxicillin-clavulanate (AUGMENTIN) 875-125 MG tablet   aspirin EC 81 MG tablet   atorvastatin (LIPITOR) 10 MG tablet   Buprenorphine HCl-Naloxone HCl 8-2 MG FILM   furosemide (LASIX) 20 MG tablet   furosemide (LASIX) 40 MG tablet   methocarbamol (ROBAXIN) 500 MG tablet   naproxen (NAPROSYN) 375 MG tablet   Potassium Chloride ER 20 MEQ TBCR   No current facility-administered medications for this encounter.    Myra Gianotti, PA-C Surgical Short Stay/Anesthesiology [facility] Phone [phone] [facility] Phone [phone] 03/04/2022 5:56 PM

## 2022-03-04 NOTE — Progress Notes (Deleted)
Cardiology Clinic Note   Patient Name: [name] Date of Encounter: 03/04/2022  Primary Care Provider:  [facility] Primary Cardiologist:  Quay Burow, MD  Patient Profile    [name] 66 year old male presents the clinic today for follow-up evaluation of his dyspnea, essential hypertension, and chest discomfort.  Past Medical History    Past Medical History:  Diagnosis Date   Arthritis    Bradycardia    Heart murmur    Hypertension    Past Surgical History:  Procedure Laterality Date   HERNIA REPAIR      Allergies  Allergies  Allergen Reactions   Sulfa Antibiotics Itching    History of Present Illness    [name] has a PMH of HTN, HLD, carotid bruit, GERD, chest discomfort, and dyspnea on exertion.  This been manage also includes 50-pack-year of tobacco use.  He was seen by Dr. Gwenlyn Found on 09/29/2020.  During that time he presented with his [name].  He had been referred from the emergency department for cardiovascular evaluation due to shortness of breath and atypical chest pain.  He had moved from [place] to [place] in 1997.  He retired from [employer].  He continues to smoke a pack per day and was reluctant to stop smoking.  His emergency department visit 07/14/2020 showed atypical chest pain and orthopnea.  It was unrevealing.  His chest CTA showed no pulmonary embolism with scattered coronary calcification.  He was treated with IV diuretic and felt better.    He presents to the clinic today for follow-up evaluation and states***.  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 Zada Finders on 03/15/2022.  *** denies chest pain, shortness of breath, lower extremity edema, fatigue, palpitations, melena, hematuria, hemoptysis, diaphoresis, weakness, presyncope, syncope, orthopnea, and PND.  DOE-breathing stable.  Remains somewhat physically active. Continue*** Heart healthy low-sodium diet-salty 6  given Increase physical activity as tolerated  Essential hypertension-BP today***.  Well-controlled at home. Continue amlodipine. Heart healthy low-sodium diet-salty 6 given Increase physical activity as tolerated  Hyperlipidemia-LDL*** Continue atorvastatin Heart healthy low-sodium high-fiber diet Increase physical activity as tolerated  Atypical chest pain-denies recent episodes.  Denies exertional chest pain.  Previous chest CTA showed scattered coronary calcification.  Strongly encouraged smoking cessation. 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 Emelda Brothers.  Patient remained stable from a cardiac standpoint.  No further cardiac testing is needed.  He is low risk for planned procedure.  His RCRI is a class I risk, 0.4% risk of major cardiac event.  He is able to complete greater than 4 METS of physical activity.   Disposition: Follow-up with Dr.Berry or me in 12 months.  Home Medications    Prior to Admission medications   Medication Sig Start Date End Date Taking? Authorizing Provider  amLODipine (NORVASC) 10 MG tablet Take 10 mg by mouth every morning.     [provider]  amoxicillin-clavulanate (AUGMENTIN) 875-125 MG tablet Take 1 tablet by mouth every 12 (twelve) hours. Patient not taking: Reported on 03/02/2022 07/31/21   Sherrill Raring, PA-C  aspirin EC 81 MG tablet Take 81 mg by mouth daily.    [provider]  atorvastatin (LIPITOR) 10 MG tablet Take 10 mg by mouth every morning.    [provider]  Buprenorphine HCl-Naloxone HCl 8-2 MG FILM Place 2 tablets under  the tongue daily.    [provider]  furosemide (LASIX) 20 MG tablet Take 1 tablet (20 mg total) by mouth daily. Patient not taking: Reported on 03/02/2022 07/15/20   Ezequiel Essex, MD   furosemide (LASIX) 40 MG tablet Take 40 mg by mouth 2 (two) times daily as needed. 10/09/21   [provider]  methocarbamol (ROBAXIN) 500 MG tablet Take 1 tablet (500 mg total) by mouth 2 (two) times daily. Patient not taking: Reported on 03/02/2022 10/07/21   Margarita Mail, PA-C  naproxen (NAPROSYN) 375 MG tablet Take 1 tablet (375 mg total) by mouth 2 (two) times daily with a meal. Patient not taking: Reported on 03/02/2022 10/07/21   Margarita Mail, PA-C  Potassium Chloride ER 20 MEQ TBCR Take 20 mEq by mouth 2 (two) times daily. 10/09/21   [provider]    Family History    Family History  Problem Relation Age of Onset   Cancer Mother    Hypertension Mother    Diabetes Mother    Cancer Father    He indicated that his mother is deceased. He indicated that his father is deceased.  Social History    Social History   Socioeconomic History   Marital status: Single    Spouse name: Not on file   Number of children: Not on file   Years of education: Not on file   Highest education level: Not on file  Occupational History   Not on file  Tobacco Use   Smoking status: Every Day    Packs/day: 1.00    Years: 25.00    Total pack years: 25.00    Types: Cigarettes   Smokeless tobacco: Never  Vaping Use   Vaping Use: Never used  Substance and Sexual Activity   Alcohol use: No   Drug use: No   Sexual activity: Not on file  Other Topics Concern   Not on file  Social History Narrative   Not on file   Social Determinants of Health   Financial Resource Strain: Not on file  Food Insecurity: Not on file  Transportation Needs: Not on file  Physical Activity: Not on file  Stress: Not on file  Social Connections: Not on file  Intimate Partner Violence: Not on file     Review of Systems    General:  No chills, fever, night sweats or weight changes.  Cardiovascular:  No chest pain, dyspnea on exertion, edema, orthopnea, palpitations, paroxysmal nocturnal  dyspnea. Dermatological: No rash, lesions/masses Respiratory: No cough, dyspnea Urologic: No hematuria, dysuria Abdominal:   No nausea, vomiting, diarrhea, bright red blood per rectum, melena, or hematemesis Neurologic:  No visual changes, wkns, changes in mental status. All other systems reviewed and are otherwise negative except as noted above.  Physical Exam    VS:  There were no vitals taken for this visit. , BMI There is no height or weight on file to calculate BMI. GEN: Well nourished, well developed, in no acute distress. HEENT: normal. Neck: Supple, no JVD, carotid bruits, or masses. Cardiac: RRR, no murmurs, rubs, or gallops. No clubbing, cyanosis, edema.  Radials/DP/PT 2+ and equal bilaterally.  Respiratory:  Respirations regular and unlabored, clear to auscultation bilaterally. GI: Soft, nontender, nondistended, BS + x 4. MS: no deformity or atrophy. Skin: warm and dry, no rash. Neuro:  Strength and sensation are intact. Psych: Normal affect.  Accessory Clinical Findings    Recent Labs: 05/12/2021: B Natriuretic Peptide 98.0; TSH 0.784 03/03/2022: ALT 13; BUN 15;  Creatinine, Ser 0.94; Hemoglobin 11.7; Platelets 243; Potassium 3.1; Sodium 136   Recent Lipid Panel    Component Value Date/Time   CHOL 182 08/10/2010 0448   TRIG 136 08/10/2010 0448   HDL 40 08/10/2010 0448   CHOLHDL 4.6 08/10/2010 0448   VLDL 27 08/10/2010 0448   LDLCALC (H) 08/10/2010 0448    115        Total Cholesterol/HDL:CHD Risk Coronary Heart Disease Risk Table                     Men   Women  1/2 Average Risk   3.4   3.3  Average Risk       5.0   4.4  2 X Average Risk   9.6   7.1  3 X Average Risk  23.4   11.0        Use the calculated Patient Ratio above and the CHD Risk Table to determine the patient's CHD Risk.        ATP III CLASSIFICATION (LDL):  <100     mg/dL   Optimal  100-129  mg/dL   Near or Above                    Optimal  130-159  mg/dL   Borderline  160-189  mg/dL    High  >190     mg/dL   Very High         ECG personally reviewed by me today- *** - No acute changes  EKG 05/12/2021  Sinus bradycardia nonspecific intraventricular conduction delay 47 bpm  Assessment & Plan   1.  ***   Jossie Ng. Jillana Selph NP-C     03/04/2022, 6:34 AM [place] [address] Office [phone] Fax [phone]    I spent***minutes examining this patient, reviewing medications, and using patient centered shared decision making involving her cardiac care.  Prior to her visit I spent greater than 20 minutes reviewing her past medical history,  medications, and prior cardiac tests.

## 2022-03-08 ENCOUNTER — Ambulatory Visit: Payer: Medicare Other | Admitting: General Practice

## 2022-03-15 ENCOUNTER — Inpatient Hospital Stay (HOSPITAL_COMMUNITY): Admission: RE | Admit: 2022-03-15 | Payer: Medicare Other | Source: Home / Self Care | Admitting: Neurological Surgery

## 2022-03-15 SURGERY — CRANIOTOMY HYPOPHYSECTOMY TRANSNASAL APPROACH
Anesthesia: General

## 2022-03-23 NOTE — Progress Notes (Signed)
Cardiology Clinic Note   Patient Name: [name] Date of Encounter: 03/24/2022  Primary Care Provider:  [facility] Primary Cardiologist:  Quay Burow, MD  Patient Profile    [name] 67 year old male presents the clinic today for follow-up evaluation of his dyspnea, essential hypertension, and chest discomfort.  Past Medical History    Past Medical History:  Diagnosis Date   Arthritis    Bradycardia    Heart murmur    Hypertension    Pituitary tumor    Past Surgical History:  Procedure Laterality Date   HERNIA REPAIR      Allergies  Allergies  Allergen Reactions   Sulfa Antibiotics Itching    History of Present Illness    [name] has a PMH of HTN, HLD, carotid bruit, GERD, chest discomfort, and dyspnea on exertion.  This been manage also includes 50-pack-year of tobacco use.  He was seen by Dr. Gwenlyn Found on 09/29/2020.  During that time he presented with his [name].  He had been referred from the emergency department for cardiovascular evaluation due to shortness of breath and atypical chest pain.  He had moved from [place] to [place] in 1997.  He retired from [employer].  He continues to smoke a pack per day and was reluctant to stop smoking.  His emergency department visit 07/14/2020 showed atypical chest pain and orthopnea.  It was unrevealing.  His chest CTA showed no pulmonary embolism with scattered coronary calcification.  He was treated with IV diuretic and felt better.  He presents to the clinic today for follow-up evaluation and states he continues to feel well from a cardiac standpoint..  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 Zada Finders on 03/15/2022.  We reviewed his previous visits and the importance of increased physical activity.  He reports that he is trying to follow a low-sodium diet.  His EKG today shows sinus bradycardia with first-degree AV block which is unchanged from his  previous.  His blood pressure initially today is 144/82 and on recheck is 136/68.  He reports that he smoked just prior to coming to his appointment today.  I encouraged him to stop smoking and we reviewed his upcoming surgery.  I will order a fasting lipids and LFTs today and plan follow-up in 12 months.  Today he denies chest pain, shortness of breath, lower extremity edema, fatigue, palpitations, melena, hematuria, hemoptysis, diaphoresis, weakness, presyncope, syncope, orthopnea, and PND.   Home Medications    Prior to Admission medications   Medication Sig Start Date End Date Taking? Authorizing Provider  amLODipine (NORVASC) 10 MG tablet Take 10 mg by mouth every morning.     [provider]  amoxicillin-clavulanate (AUGMENTIN) 875-125 MG tablet Take 1 tablet by mouth every 12 (twelve) hours. Patient not taking: Reported on 03/02/2022 07/31/21   Sherrill Raring, PA-C  aspirin EC 81 MG tablet Take 81 mg by mouth daily.    [provider]  atorvastatin (LIPITOR) 10 MG tablet Take 10 mg by mouth every morning.    [provider]  Buprenorphine HCl-Naloxone HCl 8-2 MG FILM Place 2 tablets under the tongue daily.    [provider]  furosemide (LASIX) 20 MG tablet Take 1 tablet (20 mg total) by mouth daily. Patient not taking: Reported on 03/02/2022 07/15/20   Ezequiel Essex, MD  furosemide (LASIX) 40 MG tablet Take 40 mg by mouth 2 (two) times daily as needed. 10/09/21   [provider]  methocarbamol (ROBAXIN) 500 MG tablet Take 1 tablet (500 mg total) by mouth 2 (two) times daily. Patient not taking: Reported on 03/02/2022 10/07/21   Margarita Mail, PA-C  naproxen (NAPROSYN) 375 MG tablet Take 1 tablet (375 mg total) by mouth 2 (two) times daily with a meal. Patient not taking: Reported on 03/02/2022 10/07/21   Margarita Mail, PA-C  Potassium Chloride ER 20 MEQ TBCR Take 20 mEq by mouth 2 (two) times daily. 10/09/21   [provider]     Family History    Family History  Problem Relation Age of Onset   Cancer Mother    Hypertension Mother    Diabetes Mother    Cancer Father    He indicated that his mother is deceased. He indicated that his father is deceased.  Social History    Social History   Socioeconomic History   Marital status: Single    Spouse name: Not on file   Number of children: Not on file   Years of education: Not on file   Highest education level: Not on file  Occupational History   Not on file  Tobacco Use   Smoking status: Every Day    Packs/day: 1.00    Years: 25.00    Total pack years: 25.00    Types: Cigarettes   Smokeless tobacco: Never  Vaping Use   Vaping Use: Never used  Substance and Sexual Activity   Alcohol use: No   Drug use: No   Sexual activity: Not on file  Other Topics Concern   Not on file  Social History Narrative   Not on file   Social Determinants of Health   Financial Resource Strain: Not on file  Food Insecurity: Not on file  Transportation Needs: Not on file  Physical Activity: Not on file  Stress: Not on file  Social Connections: Not on file  Intimate Partner Violence: Not on file     Review of Systems    General:  No chills, fever, night sweats or weight changes.  Cardiovascular:  No chest pain, dyspnea on exertion, edema, orthopnea, palpitations, paroxysmal nocturnal dyspnea. Dermatological: No rash, lesions/masses Respiratory: No cough, dyspnea Urologic: No hematuria, dysuria Abdominal:   No nausea, vomiting, diarrhea, bright red blood per rectum, melena, or hematemesis Neurologic:  No visual changes, wkns, changes in mental status. All other systems reviewed and are otherwise negative except as noted above.  Physical Exam    VS:  BP 136/68   Pulse (!) 44   Ht '5\' 11"'$  (1.803 m)   Wt 191 lb 9.6 oz (86.9 kg)   SpO2 98%   BMI 26.72 kg/m  , BMI Body mass index is 26.72 kg/m. GEN: Well nourished, well developed, in no acute  distress. HEENT: normal. Neck: Supple, no JVD, carotid bruits, or masses. Cardiac: RRR, no murmurs, rubs, or gallops. No clubbing, cyanosis, edema.  Radials/DP/PT 2+ and equal bilaterally.  Respiratory:  Respirations regular and unlabored, clear to auscultation bilaterally. GI: Soft, nontender, nondistended, BS + x 4. MS: no deformity or atrophy. Skin: warm and dry, no rash. Neuro:  Strength and sensation are intact. Psych: Normal affect.  Accessory Clinical Findings    Recent Labs: 05/12/2021: B Natriuretic Peptide 98.0; TSH 0.784 03/03/2022: ALT 13; BUN 15; Creatinine, Ser 0.94; Hemoglobin 11.7; Platelets 243; Potassium 3.1; Sodium 136   Recent Lipid Panel    Component Value Date/Time   CHOL 182 08/10/2010 0448   TRIG 136 08/10/2010 0448   HDL 40 08/10/2010 0448  CHOLHDL 4.6 08/10/2010 0448   VLDL 27 08/10/2010 0448   LDLCALC (H) 08/10/2010 0448    115        Total Cholesterol/HDL:CHD Risk Coronary Heart Disease Risk Table                     Men   Women  1/2 Average Risk   3.4   3.3  Average Risk       5.0   4.4  2 X Average Risk   9.6   7.1  3 X Average Risk  23.4   11.0        Use the calculated Patient Ratio above and the CHD Risk Table to determine the patient's CHD Risk.        ATP III CLASSIFICATION (LDL):  <100     mg/dL   Optimal  100-129  mg/dL   Near or Above                    Optimal  130-159  mg/dL   Borderline  160-189  mg/dL   High  >190     mg/dL   Very High         ECG personally reviewed by me today-sinus bradycardia first-degree AV block nonspecific interventricular block 44 bpm- No acute changes  EKG 05/12/2021  Sinus bradycardia nonspecific intraventricular conduction delay 47 bpm  Assessment & Plan   1.  DOE-breathing stable.  No increased DOE. Continue lasix Heart healthy low-sodium diet-salty 6 given Increase physical activity as tolerated  Essential hypertension-BP today 136/68.  Well-controlled at home. Continue  amlodipine. Heart healthy low-sodium diet-salty 6 given Increase physical activity as tolerated  Hyperlipidemia-LDL 66 09/28/20 Continue atorvastatin Heart healthy low-sodium high-fiber diet Increase physical activity as tolerated Repeat fasting lipids and lfts  Atypical chest pain-denies recent episodes.  Denies exertional chest pain.  Previous chest CTA showed scattered coronary calcification.  Strongly encouraged smoking cessation. 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 Emelda Silva.  Patient remains stable from a cardiac standpoint.     Primary Cardiologist: Quay Burow, MD  Chart reviewed as part of pre-operative protocol coverage. Given past medical history and time since last visit, based on ACC/AHA guidelines, [name] would be at acceptable risk for the planned procedure without further cardiovascular testing.   Patient was advised that if he develops new symptoms prior to surgery to contact our office to arrange a follow-up appointment.  He verbalized understanding.  I will route this recommendation to the requesting party via Epic fax function.   His RCRI is a class I risk, 0.4% risk of major cardiac event.  He is able to complete greater than 4 METS of physical activity.   Disposition: Follow-up with Dr.Berry or me in 12 months.   Todd Ng. Todd Schuff NP-C     03/24/2022, 11:34 AM [place] [address] Office [phone] Fax [phone]    I spent 14 minutes examining this patient, reviewing medications, and using patient centered shared decision making involving her cardiac care.  Prior to her visit I spent greater than 20 minutes reviewing her past medical history,  medications, and prior cardiac tests.

## 2022-03-24 ENCOUNTER — Ambulatory Visit: Payer: Medicare Other | Attending: General Practice | Admitting: General Practice

## 2022-03-24 ENCOUNTER — Encounter: Payer: Self-pay | Admitting: General Practice

## 2022-03-24 VITALS — BP 136/68 | HR 44 | Ht 71.0 in | Wt 191.6 lb

## 2022-03-24 DIAGNOSIS — R0609 Other forms of dyspnea: Secondary | ICD-10-CM

## 2022-03-24 DIAGNOSIS — E785 Hyperlipidemia, unspecified: Secondary | ICD-10-CM

## 2022-03-24 DIAGNOSIS — I10 Essential (primary) hypertension: Secondary | ICD-10-CM | POA: Diagnosis not present

## 2022-03-24 DIAGNOSIS — Z01810 Encounter for preprocedural cardiovascular examination: Secondary | ICD-10-CM

## 2022-03-24 DIAGNOSIS — R079 Chest pain, unspecified: Secondary | ICD-10-CM | POA: Diagnosis not present

## 2022-03-24 NOTE — Patient Instructions (Signed)
Medication Instructions:  The current medical regimen is effective;  continue present plan and medications as directed. Please refer to the Current Medication list given to you today.  *If you need a refill on your cardiac medications before your next appointment, please call your pharmacy*  Lab Work: FASTING LIPID AND LFT TODAY If you have labs (blood work) drawn today and your tests are completely normal, you will receive your results only by: [place] (if you have MyChart) OR  A paper copy in the mail If you have any lab test that is abnormal or we need to change your treatment, we will call you to review the results.  Testing/Procedures: NONE  Follow-Up: At [facility], you and your health needs are our priority.  As part of our continuing mission to provide you with exceptional heart care, we have created designated Provider Care Teams.  These Care Teams include your primary Cardiologist (physician) and Advanced Practice Providers (APPs -  Physician Assistants and Nurse Practitioners) who all work together to provide you with the care you need, when you need it.  Your next appointment:   12 month(s)  Provider:   Quay Burow, MD  or Coletta Memos, FNP       Other Instructions TAKE AND LOG YOUR BLOOD PRESSURE  CONTINUE WALKING 150 MODERATE PHYSICAL ACTIVITY/WEEKLY  PLEASE READ AND FOLLOW ATTACHED  SALTY 6

## 2022-03-25 LAB — HEPATIC FUNCTION PANEL
ALT: 11 IU/L (ref 0–44)
AST: 25 IU/L (ref 0–40)
Albumin: 4.6 g/dL (ref 3.9–4.9)
Alkaline Phosphatase: 50 IU/L (ref 44–121)
Bilirubin Total: 0.4 mg/dL (ref 0.0–1.2)
Bilirubin, Direct: 0.12 mg/dL (ref 0.00–0.40)
Total Protein: 7.1 g/dL (ref 6.0–8.5)

## 2022-03-25 LAB — LIPID PANEL
Chol/HDL Ratio: 3 ratio (ref 0.0–5.0)
Cholesterol, Total: 140 mg/dL (ref 100–199)
HDL: 47 mg/dL (ref >39)
LDL Chol Calc (NIH): 79 mg/dL (ref 0–99)
Triglycerides: 70 mg/dL (ref 0–149)
VLDL Cholesterol Cal: 14 mg/dL (ref 5–40)

## 2022-03-28 ENCOUNTER — Telehealth: Payer: Self-pay | Admitting: Cardiovascular Disease

## 2022-03-28 NOTE — Telephone Encounter (Signed)
Pt is returning call in regards to lab work.  Requesting return call.

## 2022-04-04 ENCOUNTER — Other Ambulatory Visit: Payer: Self-pay

## 2022-04-04 MED ORDER — ATORVASTATIN CALCIUM 20 MG PO TABS: 20.0000 mg | ORAL_TABLET | Freq: Every morning | ORAL | 6 refills | Status: DC

## 2022-04-11 ENCOUNTER — Ambulatory Visit (HOSPITAL_COMMUNITY)
Admission: EM | Admit: 2022-04-11 | Discharge: 2022-04-11 | Disposition: A | Discharge status: Home / Self Care | Payer: 59 | Attending: Registered Nurse | Admitting: Registered Nurse

## 2022-04-11 ENCOUNTER — Encounter (HOSPITAL_COMMUNITY): Payer: Self-pay | Admitting: Registered Nurse

## 2022-04-11 DIAGNOSIS — Z592 Discord with neighbors, lodgers and landlord: Secondary | ICD-10-CM | POA: Diagnosis present

## 2022-04-11 NOTE — ED Provider Notes (Signed)
Behavioral Health Urgent Care Medical Screening Exam  Patient Name: Sahith Nurse MRN: [id number] Date of Evaluation: 04/11/22 Chief Complaint:   Diagnosis:  Final diagnoses:  Discord with neighbors, lodgers and landlord    History of Present illness: Uziel Covault is a 67 y.o. male patient presented to [facility] as a walk in requesting a letter for service animal.    [name], 67 y.o., male patient seen face to face by this provider, consulted with Dr. Hampton Abbot; and chart reviewed on 04/11/22.  On evaluation Reo Portela reports he came in today related to needing a letter for emotional support animal.  States he and his son moving into a new apartment and he was informed that he would need a letter from his doctor stating that his dog was an emotional support animal before he could move in or they could not move into the apartment.  Patient reporting that he became upset when he saw other dogs in the apartment complex that were bigger than his dog and states he was told that his dog would need to weigh 25 pounds.  Patient denies psychiatric history.  He also denies suicidal/self-harm/homicidal ideation, psychosis, and paranoia.  States just came in got get support letter for his dog.  During evaluation Janziel Hockett is sitting up right in chair with no noted distress.  He is alert/oriented x 4, calm, cooperative, attentive, and responses were relevant and appropriate to assessment questions.  He spoke in a clear tone at moderate volume, and normal pace, with good eye contact.   He denies suicidal/self-harm/homicidal ideation, psychosis, and paranoia.  Objectively:  there is no evidence of psychosis/mania or delusional thinking.  He conversed coherently, with goal directed thoughts, and no distractibility, or pre-occupation and he has denied suicidal/self-harm/homicidal ideation, psychosis, and paranoia.    Flowsheet Row Pre-Admission Testing 62 from 03/03/2022 in [facility] PREADMISSION  TESTING ED from 10/07/2021 in [facility] Emergency Department at [facility] ED from 07/31/2021 in [facility] Emergency Department at [place] No Risk No Risk No Risk       Psychiatric Specialty Exam  Presentation  General Appearance:Appropriate for Environment; Casual  Eye Contact:Good  Speech:Clear and Coherent; Normal Rate  Speech Volume:Normal  Handedness:No data recorded  Mood and Affect  Mood: Anxious; Euthymic  Affect: Appropriate; Congruent   Thought Process  Thought Processes: Coherent; Goal Directed  Descriptions of Associations:Intact  Orientation:Full (Time, Place and Person)  Thought Content:Logical    Hallucinations:None  Ideas of Reference:None  Suicidal Thoughts:No  Homicidal Thoughts:No   Sensorium  Memory: Immediate Good; Recent Good; Remote Good  Judgment: Intact  Insight: Present   Executive Functions  Concentration: Good  Attention Span: Good  Recall: Good  Fund of Knowledge: Good  Language: Good   Psychomotor Activity  Psychomotor Activity: Normal   Assets  Assets: Communication Skills; Desire for Improvement; Financial Resources/Insurance; Housing; Physical Health; Resilience; Social Support; Talents/Skills   Sleep  Sleep: Good  Number of hours: No data recorded  Physical Exam: Physical Exam Vitals and nursing note reviewed. Exam conducted with a chaperone present.  Constitutional:      General: He is not in acute distress.    Appearance: Normal appearance. He is not ill-appearing.  HENT:     Head: Normocephalic.  Eyes:     Conjunctiva/sclera: Conjunctivae normal.  Cardiovascular:     Rate and Rhythm: Normal rate and regular rhythm.  Pulmonary:     Effort: Pulmonary  effort is normal.     Breath sounds: Normal breath sounds.  Musculoskeletal:        General: Normal range of motion.     Cervical back: Normal range of motion.  Skin:    General:  Skin is warm and dry.  Neurological:     Mental Status: He is alert and oriented to person, place, and time.  Psychiatric:        Attention and Perception: Attention and perception normal. He does not perceive auditory or visual hallucinations.        Mood and Affect: Mood and affect normal.        Speech: Speech normal.        Behavior: Behavior normal. Behavior is cooperative.        Thought Content: Thought content normal. Thought content is not paranoid or delusional. Thought content does not include homicidal or suicidal ideation.        Cognition and Memory: Cognition normal.        Judgment: Judgment normal.    Review of Systems  Psychiatric/Behavioral:  Negative for depression, hallucinations, substance abuse and suicidal ideas. The patient is nervous/anxious. The patient does not have insomnia.   All other systems reviewed and are negative.  Blood pressure (!) 128/90, pulse (!) 54, temperature 98.3 F (36.8 C), temperature source Oral, resp. rate 16, height '5\' 10"'$  (1.778 m), weight 193 lb (87.5 kg), SpO2 100 %. Body mass index is 27.69 kg/m.  Musculoskeletal: Strength & Muscle Tone: within normal limits Gait & Station: normal Patient leans: N/A   [place] MSE Discharge Disposition for Follow up and Recommendations: Based on my evaluation the patient does not appear to have an emergency medical condition and can be discharged with resources and follow up care in outpatient services for Individual Therapy    Discharge Instructions      Emotional Support Animal options:  Log on to [email or web address], enter the requested info and then click the link for a consultation to speak with a Licensed mental health professional.  If approved, you will receive the letter same day.  Fee for this service is $89.98 - total fee for the letter and certificate.        Nevin Kozuch, NP 04/11/2022, 2:25 PM

## 2022-04-11 NOTE — Discharge Instructions (Addendum)
Emotional Support Animal options:  Log on to [email or web address], enter the requested info and then click the link for a consultation to speak with a Licensed mental health professional.  If approved, you will receive the letter same day.  Fee for this service is $89.98 - total fee for the letter and certificate.

## 2022-04-28 ENCOUNTER — Ambulatory Visit (INDEPENDENT_AMBULATORY_CARE_PROVIDER_SITE_OTHER): Payer: 59 | Admitting: Mental Health

## 2022-04-28 DIAGNOSIS — F4323 Adjustment disorder with mixed anxiety and depressed mood: Secondary | ICD-10-CM | POA: Diagnosis not present

## 2022-04-28 NOTE — Progress Notes (Signed)
Comprehensive Clinical Assessment (CCA) Note  04/28/2022 [name] [id number]  Chief Complaint:  Chief Complaint  Patient presents with   Adjustment Disorder   Visit Diagnosis: Adjustment disorder with mixed anxiety and depression   CCA Screening, Triage and Referral (STR)  Patient Reported Information  Referral name: [facility]   Whom do you see for routine medical problems? Primary Care   What Is the Reason for Your Visit/Call Today? " I been depressed without [name] ( dog), they said I can't have him." - Shares has not been able to have his dog in the past x 2 months.  How Long Has This Been Causing You Problems? 1-6 months  What Do You Feel Would Help You the Most Today? Treatment for Depression or other mood problem   Have You Recently Been in Any Inpatient Treatment (Hospital/Detox/Crisis Center/28-Day Program)? No  Have You Ever Received Services From [employer] Before? No   Have You Recently Had Any Thoughts About Hurting Yourself? No  Are You Planning to Commit Suicide/Harm Yourself At This time? No   Have you Recently Had Thoughts About [place]? No   Have You Used Any Alcohol or Drugs in the Past 24 Hours? No  Do You Currently Have a Therapist/Psychiatrist? Yes  Name of Therapist/Psychiatrist: [facility]- counselor- [facility] - Dr. Janyth Silva   Have You Been Recently Discharged From Any Office Practice or Programs? No     CCA Screening Triage Referral Assessment Type of Contact: Face-to-Face  Is CPS involved or ever been involved? Never  Is APS involved or ever been involved? Never   Patient Determined To Be At Risk for Harm To Self or Others Based on Review of Patient Reported Information or Presenting Complaint? No  Method: No Plan  Availability of Means: No access or NA  Intent: Vague intent or NA  Notification Required: No need or identified person  Are There Guns or Other Weapons in Your Home? No  Types of  Guns/Weapons: NONE  Location of Assessment: [facility]   Does Patient Present under Involuntary Commitment? No  IVC Papers Initial File Date: No data recorded  [place] of Residence: [place]   Patient Currently Receiving the Following Services: Individual Therapy ([facility] counselor)   Determination of Need: Routine (7 days)   Options For Referral: Outpatient Therapy     CCA Biopsychosocial Intake/Chief Complaint:  " I been depressed without [name] ( dog), they said I can't have him." - Shares has not been able to have his dog in the past x 2 months. [name] is a 67 year old divorced African-American male who presents for routine walk in assessment to engage in outpatient therapy serevices; shars to currently be engaged with substance abuse couseling for the past x 5 years, currently on suboxone with history of use of opiates/pain pills in the past. 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 Shares history of depression dating back to his 37's and reports having his pet supporting in managing his sxs and keeping him active. Reports to frequency worry about his dog, [name], who is currently residing at his son's home. Denies history of mental health concerns or inpatient admissions due to mental health concerns.  Current Symptoms/Problems: No data recorded  Patient Reported Schizophrenia/Schizoaffective Diagnosis in Past: No  Strengths: No data recorded Preferences: No data recorded Abilities: No data recorded  Type of Services Patient Feels are Needed: No data recorded  Initial Clinical Notes/Concerns: No data recorded  Mental Health Symptoms Depression:   Tearfulness; Hopelessness; Worthlessness; Fatigue   Duration of Depressive symptoms:  Greater than two weeks    Mania:   None   Anxiety:    Tension; Worrying; Restlessness   Psychosis:   None   Duration of Psychotic symptoms: No data recorded  Trauma:   None   Obsessions:   None   Compulsions:   None   Inattention:   None   Hyperactivity/Impulsivity:   None   Oppositional/Defiant Behaviors:   None   Emotional Irregularity:   None   Other Mood/Personality Symptoms:  No data recorded   Mental Status Exam Appearance and self-care  Stature:   Tall   Weight:   Average weight   Clothing:   Careless/inappropriate   Grooming:   Normal   Cosmetic use:   None   Posture/gait:   Normal   Motor activity:   Not Remarkable   Sensorium  Attention:   Normal   Concentration:   Normal   Orientation:   X5   Recall/memory:   Normal   Affect and Mood  Affect:   Anxious; Depressed   Mood:   Anxious; Depressed   Relating  Eye contact:   Normal   Facial expression:   Sad   Attitude toward examiner:   Cooperative   Thought and Language  Speech flow:  Articulation error   Thought content:   Appropriate to Mood and Circumstances   Preoccupation:   None   Hallucinations:   None   Organization:  No data recorded  [employer] of Knowledge:   Good   Intelligence:   Average   Abstraction:   Functional   Judgement:   Good   Reality Testing:   Realistic   Insight:   Good   Decision Making:   Normal   Social Functioning  Social Maturity:   Responsible   Social Judgement:   Normal   Stress  Stressors:   Other (Comment) (Not able to have his dog in the apartment in which he resides)   Coping Ability:   Overwhelmed   Skill Deficits:   None   Supports:   Family; Friends/Service system     Religion: Religion/Spirituality Are You A Religious Person?: Yes What is Your 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: Leisure / Recreation Do You Have Hobbies?: Yes Leisure and Hobbies: Video  games  Exercise/Diet: Exercise/Diet Do You Exercise?: Yes What Type of Exercise Do You Do?: Run/Walk How Many Times a Week Do You Exercise?: 6-7 times a week Have You Gained or Lost A Significant Amount of Weight in the Past Six Months?: No Do You Follow a Special Diet?: No Do You Have Any Trouble Sleeping?: No (can be restless at times)   CCA Employment/Education Employment/Work Situation: Employment / Work Situation Employment Situation: On disability Why is Patient on Disability: Medical- knees How Long has Patient Been on Disability: Since his late 30's What is the Longest Time Patient has Held a Job?: 5-6 years 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 Shares mother reported may be dyslexic) Were Any Medications Ever Prescribed For These Difficulties?: No Patient's Education Has Been Impacted by Current Illness: No   CCA Family/Childhood History Family and Relationship History: Family history Marital status: Divorced Divorced, when?: over 60 years What types of issues is patient dealing with in the relationship?: shares to be real close to his ex-wife Additional relationship information: Shares to have a male friend that he has been seeing for the past x 10 years Are you sexually active?: No What is your sexual orientation?: heterosexual Has your sexual activity been affected by drugs, alcohol, medication, or emotional stress?: NA Does patient have children?: Yes How many children?: 5 (x 1 son and x 1 daughter biological - x 3 step-children) How is patient's relationship with their children?: Shares to have x 5 children that he is close  to.  Childhood History:  Childhood History By whom was/is the patient raised?: Both parents Additional childhood history information: Straton was raised by both parents born and raised in [place] ( has been in [place] since 1997). Juelz describes his childhood as "Good, it was the perfect family." Shares to have had x 5 siblings growing up in the house hold. Description of patient's relationship with caregiver when they were a child: Mother: good Father: good Patient's description of current relationship with people who raised him/her: Parents are both deceased How were you disciplined when you got in trouble as a child/adolescent?: - Does patient have siblings?: Yes Number of Siblings: 5 Description of patient's current relationship with siblings: Shares to be close to siblings but shares to not see them much due to at times x 1 siblings using substances and others drinking Did patient suffer any verbal/emotional/physical/sexual abuse as a child?: No Did patient suffer from severe childhood neglect?: No Has patient ever been sexually abused/assaulted/raped as an adolescent or adult?: No Was the patient ever a victim of a crime or a disaster?: No Witnessed domestic violence?: No Has patient been affected by domestic violence as an adult?: No  Child/Adolescent Assessment:     CCA Substance Use Alcohol/Drug Use: Alcohol / Drug Use History of alcohol / drug use?: Yes Substance #1 Name of Substance 1: Pain pills 1 - Age of First Use: 34 1 - Amount (size/oz): 3 to 4 pills at a time 1 - Frequency: daily 1 - Last Use / Amount: 6 years ago 1 - Method of Aquiring: illegal purchase 1- Route of Use: oral                       ASAM's:  Six Dimensions of Multidimensional Assessment  Dimension 1:  Acute Intoxication and/or Withdrawal Potential:      Dimension 2:  Biomedical Conditions and Complications:      Dimension 3:  Emotional, Behavioral, or Cognitive Conditions and Complications:      Dimension 4:  Readiness to Change:     Dimension 5:  Relapse, Continued use, or Continued Problem Potential:     Dimension 6:  Recovery/Living Environment:     ASAM Severity Score:    ASAM Recommended Level of Treatment:     Substance use Disorder (SUD)    Recommendations for Services/Supports/Treatments: Recommendations for Services/Supports/Treatments Recommendations For Services/Supports/Treatments: Individual Therapy  DSM5 Diagnoses: Patient Active Problem List   Diagnosis Date Noted   Adjustment disorder with mixed anxiety and depressed mood 04/28/2022   Discord with neighbors, lodgers and landlord 04/11/2022   Dyspnea  on exertion 09/29/2020   Essential hypertension 09/29/2020   Hyperlipidemia 09/29/2020   Summary:   [name] is a 67 year old divorced African-American male who presents for routine walk in assessment to engage in outpatient therapy serevices; shars to currently be engaged with substance abuse couseling for the past x 5 years, currently on suboxone with history of use of opiates/pain pills in the past. 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 Shares history of depression dating back to his 48's and reports having his pet supporting in managing his sxs and keeping him active. Reports to frequency worry about his dog, [name], who is currently residing at his son's home. Denies history of mental health concerns or inpatient admissions due to mental health concerns.   [place] presents to session alert and oriented; mood and affect low; tearful at times. Speech clear and coherent at normal rate and tone. Engaged and cooperative with assessment. Kardin shares main stressor of not being able to have his bet dog, [name] in his apartment. Shares several months ago leasing office reported  he was no longer able to have is pet, initially noting his weight and then noting his breed. Shares since no longer having is pet feelings of depression have presented. Shares sxs of crying spells, feelings of hopelessness and worthlessness and fatigue. Denies hx of or current suicidal thoughts; no history of suicide attempts. Shares to frequently worry about [name] who currently is residing with his son and shares to feel as if son does not care for him as he does with concern [name] will run out in street and get hit by a car and notes a previous dog has been hit by a car. Denies mood swings/mania.Denies anger concerns. Denies trauma sxs. No psychosis reported or observed. Shares history of substance use - pain pills, and currently on suboxone. Denies use of opiates in the past 5 years and currently engaged in [facility] individual counseling at rate of x 1 monthly. States with [name] has supported in alleviating previous hx of depression sxs and supports in maintaining his sobriety. Currently not in the work force; on disability. No hx of legal concerns reported. Denies SI/HI/AVH. CSSRS, pain, nutrition, GAD and PHQ completed.   Patient Centered Plan: Patient is on the following Treatment Plan(s):  Anxiety and Depression   Referrals to Alternative Service(s): Referred to Alternative Service(s):   Place:   Date:   Time:    Referred to Alternative Service(s):   Place:   Date:   Time:    Referred to Alternative Service(s):   Place:   Date:   Time:    Referred to Alternative Service(s):   Place:   Date:   Time:      Collaboration of Care: Other None  Patient/Guardian was advised Release of Information must be obtained prior to any record release in order to collaborate their care with an outside provider. Patient/Guardian was advised if they have not already done so to contact the registration department to sign all necessary forms in order for [place] to release information regarding their care.   Consent:  Patient/Guardian gives verbal consent for treatment and assignment of benefits for services provided during this visit. Patient/Guardian expressed understanding and agreed to proceed.   Marion Downer, [facility]

## 2022-05-04 ENCOUNTER — Ambulatory Visit (HOSPITAL_COMMUNITY): Payer: 59 | Admitting: Licensed Clinical Social Worker

## 2022-05-08 ENCOUNTER — Encounter (HOSPITAL_COMMUNITY): Payer: Self-pay

## 2022-05-08 ENCOUNTER — Other Ambulatory Visit: Payer: Self-pay

## 2022-05-08 ENCOUNTER — Emergency Department (HOSPITAL_COMMUNITY)
Admission: EM | Admit: 2022-05-08 | Discharge: 2022-05-08 | Disposition: A | Discharge status: Home / Self Care | Payer: 59 | Attending: Emergency Medicine | Admitting: Emergency Medicine

## 2022-05-08 DIAGNOSIS — K0889 Other specified disorders of teeth and supporting structures: Secondary | ICD-10-CM | POA: Insufficient documentation

## 2022-05-08 MED ORDER — AMOXICILLIN-POT CLAVULANATE 875-125 MG PO TABS: 1.0000 | ORAL_TABLET | Freq: Two times a day (BID) | ORAL | 0 refills | Status: DC

## 2022-05-08 MED ORDER — IBUPROFEN 800 MG PO TABS
800.0000 mg | ORAL_TABLET | Freq: Once | ORAL | Status: AC
  Administered 2022-05-08: 800 mg via ORAL
  Filled 2022-05-08: qty 1

## 2022-05-08 NOTE — ED Triage Notes (Signed)
Pt presents to ED from home C/O R dental pain X months. Pt reports, "I been to the dentist and they gave me some antibiotics but it wasn't the strong kind. I need some of the stronger one."

## 2022-05-08 NOTE — Discharge Instructions (Signed)
You were seen in the emergency department for dental pain.  As we discussed I think your pain is likely related to your cavities. We normally treat this with anti-inflammatories and antibiotics.   I've attached a resource guide with several dentists in the area. It's incredibly important you follow up with a dentist as soon as possible for definitive treatment.   Continue to monitor how you're doing and return to the ER for new or worsening symptoms such as difficulty swallowing your own saliva, difficulty breathing, or fever.

## 2022-05-08 NOTE — ED Provider Notes (Signed)
[facility] Provider Note   CSN: [id number] Arrival date & time: 05/08/22  1332     History  Chief Complaint  Patient presents with   Dental Pain    [name] is a 67 y.o. male.  Patient with no pertinent past medical history presents today with complaints of dental pain. He states that same has been ongoing for the past month. He went to a dentist and they gave him amoxicillin which he completed without improvement in symptoms. He reports that he returned to the dentist and they told him they could not perform dental extraction until his pain had improved, however they did not give him any additional medication. I am unable to review these notes. He denies fevers or chills. He is able to swallow without difficulty. Pain is in the lower anterior teeth and right lower molars  The history is provided by the patient. No language interpreter was used.  Dental Pain      Home Medications Prior to Admission medications   Medication Sig Start Date End Date Taking? Authorizing Provider  amLODipine (NORVASC) 10 MG tablet Take 10 mg by mouth every morning.     [provider]  amoxicillin-clavulanate (AUGMENTIN) 875-125 MG tablet Take 1 tablet by mouth every 12 (twelve) hours. 07/31/21   Sherrill Raring, PA-C  aspirin EC 81 MG tablet Take 81 mg by mouth daily.    [provider]  atorvastatin (LIPITOR) 20 MG tablet Take 1 tablet (20 mg total) by mouth every morning. 04/04/22   Deberah Pelton, NP  Buprenorphine HCl-Naloxone HCl 8-2 MG FILM Place 2 tablets under the tongue daily.    [provider]  furosemide (LASIX) 20 MG tablet Take 1 tablet (20 mg total) by mouth daily. 07/15/20   Rancour, Annie Main, MD  furosemide (LASIX) 40 MG tablet Take 40 mg by mouth 2 (two) times daily as needed. 10/09/21   [provider]  methocarbamol (ROBAXIN) 500 MG tablet Take 1 tablet (500 mg total) by mouth 2 (two) times daily. 10/07/21    Margarita Mail, PA-C  naproxen (NAPROSYN) 375 MG tablet Take 1 tablet (375 mg total) by mouth 2 (two) times daily with a meal. 10/07/21   Margarita Mail, PA-C  Potassium Chloride ER 20 MEQ TBCR Take 20 mEq by mouth 2 (two) times daily. 10/09/21   [provider]      Allergies    Sulfa antibiotics    Review of Systems   Review of Systems  HENT:  Positive for dental problem.   All other systems reviewed and are negative.   Physical Exam Updated Vital Signs BP (!) 148/99   Pulse (!) 49   Temp 97.6 F (36.4 C) (Oral)   Resp 16   Ht '5\' 10"'$  (1.778 m)   Wt 85.7 kg   SpO2 98%   BMI 27.12 kg/m  Physical Exam Vitals and nursing note reviewed.  Constitutional:      General: He is not in acute distress.    Appearance: Normal appearance. He is normal weight. He is not ill-appearing, toxic-appearing or diaphoretic.  HENT:     Head: Normocephalic and atraumatic.     Mouth/Throat:     Comments: Dentition poor throughout with multiple dental caries, cracked molars on the right side. No gross abscess, facial swelling, or swelling under the tongue Cardiovascular:     Rate and Rhythm: Normal rate.  Pulmonary:     Effort: Pulmonary effort is normal.  No respiratory distress.  Musculoskeletal:        General: Normal range of motion.     Cervical back: Normal range of motion.  Skin:    General: Skin is warm and dry.  Neurological:     General: No focal deficit present.     Mental Status: He is alert.  Psychiatric:        Mood and Affect: Mood normal.        Behavior: Behavior normal.     ED Results / Procedures / Treatments   Labs (all labs ordered are listed, but only abnormal results are displayed) Labs Reviewed - No data to display  EKG None  Radiology No results found.  Procedures Procedures    Medications Ordered in ED Medications - No data to display  ED Course/ Medical Decision Making/ A&P                             Medical Decision  Making  Patient presents today with complaints of right lower dental pain. He is afebrile, non-toxic appearing, and in no acute distress with reassuring vital signs. Physical exam reveals multiple dental caries throughout the mouth. No gross abscess.  Exam unconcerning for Ludwig's angina or spread of infection.  Given he did not have improvement with Amoxicillin, will treat with Augmentin.  Urged patient to follow-up with his dentist for definitive management.  Patient is understanding and amenable with plan, educated on red flag symptoms that would prompt immediate return. Patient discharged in stable condition.   Final Clinical Impression(s) / ED Diagnoses Final diagnoses:  Pain, dental    Rx / DC Orders ED Discharge Orders          Ordered    amoxicillin-clavulanate (AUGMENTIN) 875-125 MG tablet  Every 12 hours        05/08/22 1425          An After Visit Summary was printed and given to the patient.     Bud Face, PA-C 05/08/22 1426    Leanord Asal K, DO 05/08/22 1626

## 2022-05-13 ENCOUNTER — Telehealth: Payer: Self-pay

## 2022-05-13 NOTE — Telephone Encounter (Signed)
     Patient  visit on 05/08/2022  at [facility] was for pain, dental.  Have you been able to follow up with your primary care physician? Patient has appointment with [facility].  The patient was or was not able to obtain any needed medicine or equipment. Patient was able to obtain medication.  Are there diet recommendations that you are having difficulty following? No  Patient expresses understanding of discharge instructions and education provided has no other needs at this time. Yes  Verified mailing address to send [facility].   [place] Guide   [email or web address]  ?? [id number]   Website: [email or web address]  [email or web address]

## 2022-05-16 ENCOUNTER — Ambulatory Visit (HOSPITAL_COMMUNITY): Payer: 59 | Admitting: Licensed Clinical Social Worker

## 2022-05-18 ENCOUNTER — Ambulatory Visit (INDEPENDENT_AMBULATORY_CARE_PROVIDER_SITE_OTHER): Payer: 59 | Admitting: Licensed Clinical Social Worker

## 2022-05-18 DIAGNOSIS — F4323 Adjustment disorder with mixed anxiety and depressed mood: Secondary | ICD-10-CM | POA: Diagnosis not present

## 2022-05-18 NOTE — Progress Notes (Signed)
   THERAPIST PROGRESS NOTE  Session Time: 41  Participation Level: Active  Behavioral Response: CasualAlertAnxious and Depressed  Type of Therapy: Individual Therapy  Treatment Goals addressed:   Active     Anxiety     LTG: Pranay will score less than 5 on the Generalized Anxiety Disorder 7 Scale (GAD-7)      Start:  05/18/22    Expected End:  10/12/22         identify 3 triggers for anxiety      Start:  05/18/22    Expected End:  10/12/22         idenitfy 3 coping skills for anxiety      Start:  05/18/22    Expected End:  10/12/22            ProgressTowards Goals: Initial  Interventions: CBT, Motivational Interviewing, and Supportive  Summary: [name] is a 67 y.o. male who presents with depressed and anxious mood\affect.  Patient was pleasant, cooperative, maintained good eye contact.  He engaged well in therapy session was dressed casually.  Patient reports primary stressor as keeping his emotional support animal.  Patient states that he recently moved into a new apartment complex where the housing manager has been difficult to deal with as far as an emotional support animal is concerned.  [name] reports frustration as other emotional support animals have been allowed.  Patient reports that he got an emotional support letter from his doctor who prescribes him Suboxone.  Patient states that he then provided the emotional support letter and fee for pet to the housing manager.  Careers adviser stated to him that they would need to check with higher management.  After 2 weeks patient was supposed to hear response and never did.  [name] reports he now has brought the dog into housing that he currently lives in.   [name] endorses tension and worry as symptoms for anxiety.  Patient endorses tearfulness and sadness as symptoms for depression.  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  Suicidal/Homicidal: Nowithout intent/plan  Therapist Response:    Intervention/Plan: LCSW provided patient with psychoanalytic therapy for patient to express thoughts, feelings, and emotions.  LCSW supportive therapy for praise and encouragement.  LCSW used open-ended questions and reflective listening for motivational interviewing.  LCSW updated treatment plan to reflect this LCSW's goals and objectives for treatment.  Plan: Return again in 3 weeks.  Diagnosis: Adjustment disorder with mixed anxiety and depressed mood  Collaboration of Care: Other None today   Patient/Guardian was advised Release of Information must be obtained prior to any record release in order to collaborate their care with an outside provider. Patient/Guardian was advised if they have not already done so to contact the registration department to sign all necessary forms in order for [place] to release information regarding their care.   Consent: Patient/Guardian gives verbal consent for treatment and assignment of benefits for services provided during this visit. Patient/Guardian expressed understanding and agreed to proceed.   [name], LCSW 05/18/2022

## 2022-06-15 ENCOUNTER — Ambulatory Visit (HOSPITAL_COMMUNITY): Payer: 59 | Admitting: Licensed Clinical Social Worker

## 2022-06-16 IMAGING — DX DG CHEST 1V PORT
1 series · 1 of 1 positions shown · non-contrast
Comparison: 07/16/2019

CLINICAL DATA: Dyspnea

EXAM:
PORTABLE CHEST 1 VIEW

[chest ap]
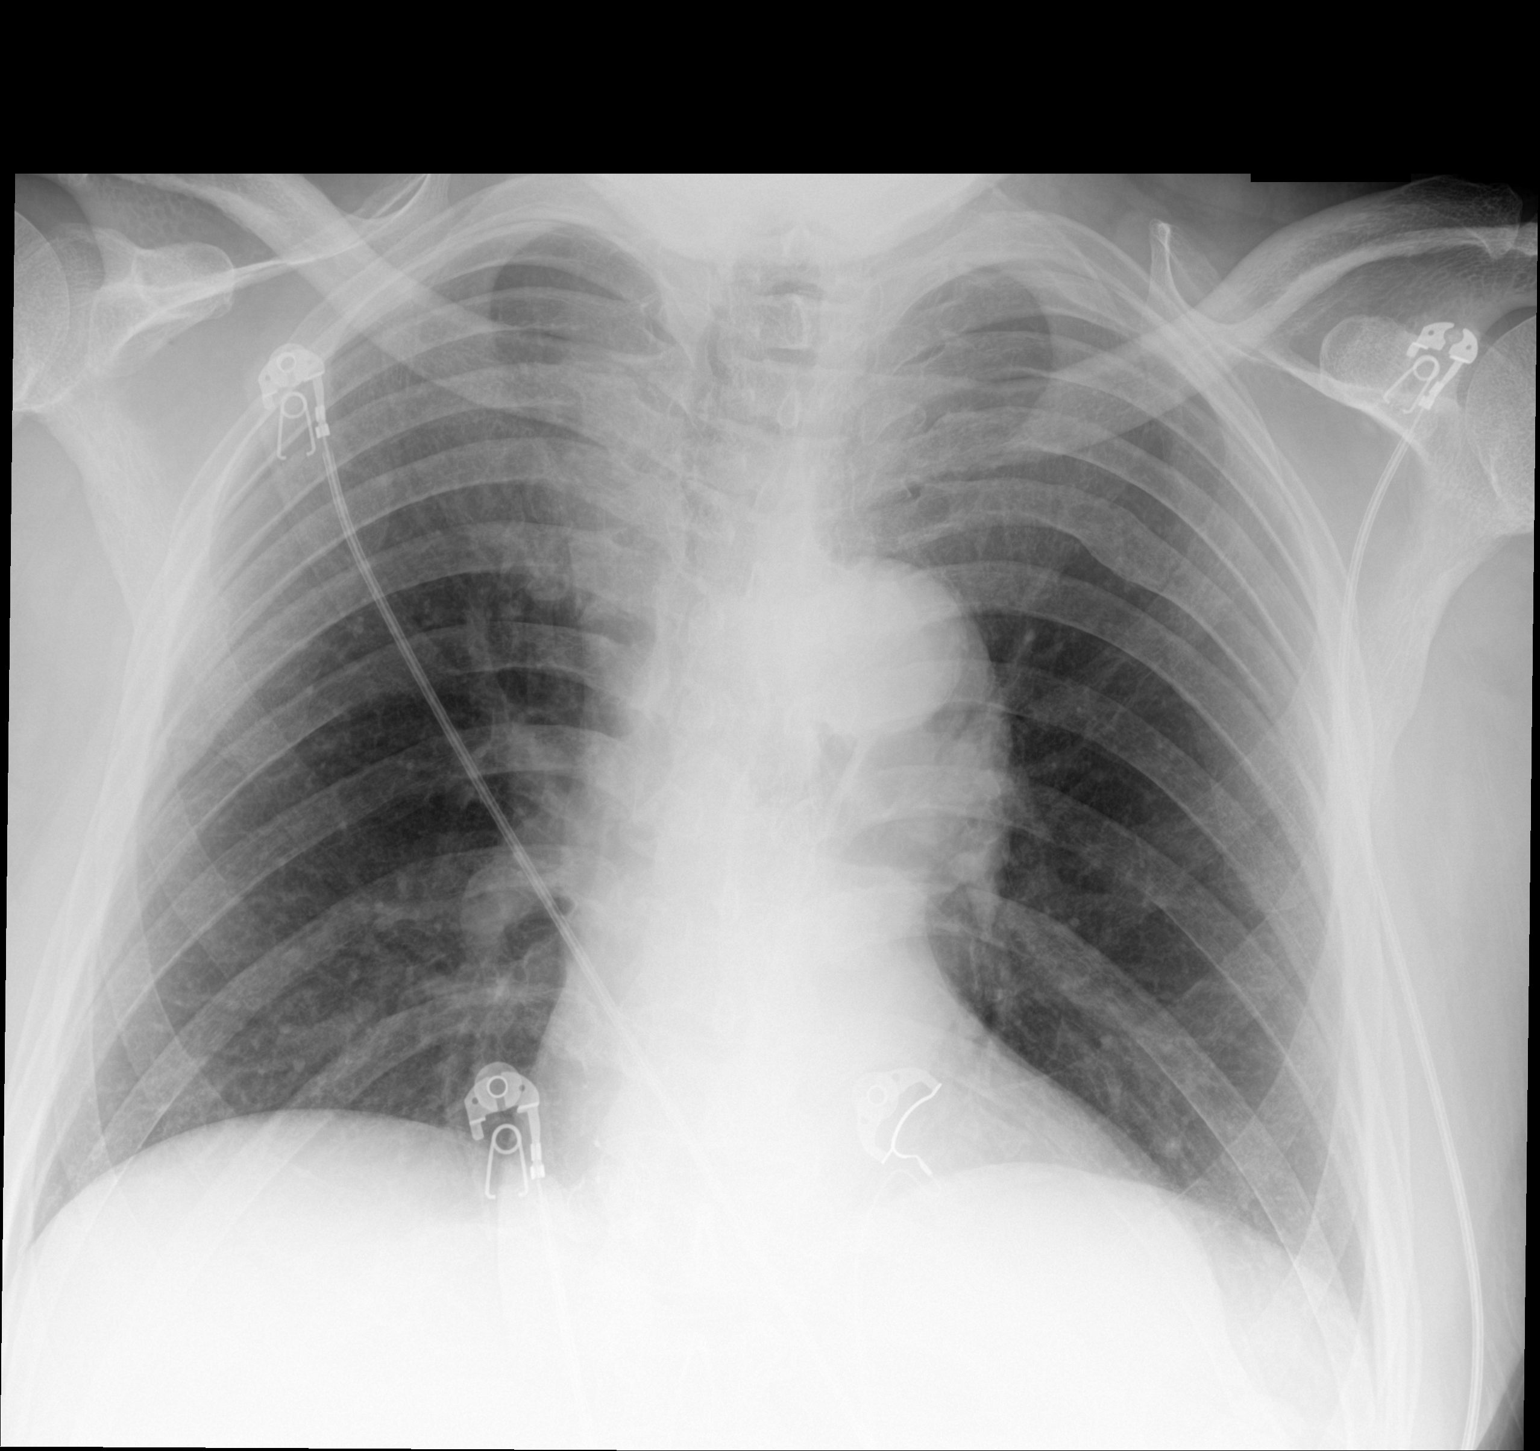

[1 of 1 positions shown; findings below may reference images not displayed]

FINDINGS: The lungs are symmetrically well expanded. No pneumothorax or
pleural effusion. Cardiac size within normal limits. The thoracic
aorta is ectatic, likely accentuated by semi-erect positioning.
Cardiac size within normal limits. Expansile mixed lytic and
sclerotic lesion within the posterior left sixth rib is stable from
prior CT examination of 08/20/2013, likely representing a small
enchondroma.
IMPRESSION: No active disease.

## 2022-07-04 ENCOUNTER — Encounter (HOSPITAL_COMMUNITY): Payer: Self-pay

## 2022-07-04 ENCOUNTER — Ambulatory Visit (HOSPITAL_COMMUNITY): Payer: 59 | Admitting: Licensed Clinical Social Worker

## 2022-08-17 DIAGNOSIS — Z1212 Encounter for screening for malignant neoplasm of rectum: Secondary | ICD-10-CM | POA: Diagnosis not present

## 2022-08-17 DIAGNOSIS — Z1211 Encounter for screening for malignant neoplasm of colon: Secondary | ICD-10-CM | POA: Diagnosis not present

## 2022-08-23 LAB — COLOGUARD: COLOGUARD: NEGATIVE

## 2022-08-31 DIAGNOSIS — K047 Periapical abscess without sinus: Secondary | ICD-10-CM | POA: Diagnosis not present

## 2022-10-17 DIAGNOSIS — R600 Localized edema: Secondary | ICD-10-CM | POA: Diagnosis not present

## 2022-10-17 DIAGNOSIS — M159 Polyosteoarthritis, unspecified: Secondary | ICD-10-CM | POA: Diagnosis not present

## 2022-10-17 DIAGNOSIS — E782 Mixed hyperlipidemia: Secondary | ICD-10-CM | POA: Diagnosis not present

## 2022-10-17 DIAGNOSIS — I10 Essential (primary) hypertension: Secondary | ICD-10-CM | POA: Diagnosis not present

## 2022-10-20 DIAGNOSIS — Z79899 Other long term (current) drug therapy: Secondary | ICD-10-CM | POA: Diagnosis not present

## 2022-10-21 ENCOUNTER — Other Ambulatory Visit: Payer: Self-pay | Admitting: General Practice

## 2022-11-02 ENCOUNTER — Encounter (HOSPITAL_COMMUNITY): Payer: Self-pay

## 2022-11-02 ENCOUNTER — Ambulatory Visit (HOSPITAL_COMMUNITY)
Admission: EM | Admit: 2022-11-02 | Discharge: 2022-11-02 | Disposition: A | Discharge status: Home / Self Care | Payer: 59 | Attending: Internal Medicine | Admitting: Internal Medicine

## 2022-11-02 DIAGNOSIS — R519 Headache, unspecified: Secondary | ICD-10-CM | POA: Diagnosis present

## 2022-11-02 DIAGNOSIS — R059 Cough, unspecified: Secondary | ICD-10-CM | POA: Diagnosis present

## 2022-11-02 DIAGNOSIS — J069 Acute upper respiratory infection, unspecified: Secondary | ICD-10-CM | POA: Diagnosis not present

## 2022-11-02 DIAGNOSIS — F17210 Nicotine dependence, cigarettes, uncomplicated: Secondary | ICD-10-CM | POA: Insufficient documentation

## 2022-11-02 DIAGNOSIS — U071 COVID-19: Secondary | ICD-10-CM | POA: Diagnosis not present

## 2022-11-02 LAB — SARS CORONAVIRUS 2 (TAT 6-24 HRS): SARS Coronavirus 2: POSITIVE — AB

## 2022-11-02 MED ORDER — BENZONATATE 100 MG PO CAPS: 100.0000 mg | ORAL_CAPSULE | Freq: Three times a day (TID) | ORAL | 0 refills | Status: AC

## 2022-11-02 NOTE — Discharge Instructions (Addendum)
 Your symptoms are most likely due to a viral illness, which will improve on its own with rest and fluids. COVID testing is pending, staff will call you if this is positive. Wear a mask for 5 days of symptoms while you are in public, then you may remove your mask. You may go back to work if you do not have a fever for 24 hours without any medicines.  - Take prescribed medicines to help with symptoms: tessalon perles - Use over the counter medicines to help with symptoms as discussed: Tylenol, guaifenesin (mucinex), zyrtec, etc - Two teaspoons of honey in warm water every 4-6 hours may help with throat pains - Humidifier in your room at night to help add water the air and soothe cough  If you develop any new or worsening symptoms or do not improve in the next 2 to 3 days, please return.  If your symptoms are severe, please go to the emergency room.  Follow-up with PCP as needed.

## 2022-11-02 NOTE — ED Triage Notes (Signed)
Pt  c/o cough, congestion, body aches, and headaches x2 days. Denies taking meds, requesting COVID testing.

## 2022-11-02 NOTE — ED Provider Notes (Signed)
[facility]    CSN: [id number] Arrival date & time: 11/02/22  1233      History   Chief Complaint Chief Complaint  Patient presents with   Cough    HPI [name] is a 67 y.o. male.   Patient presents to urgent care for evaluation of cough, nasal congestion, sore throat, and generalized headache that started 2 days ago on October 31, 2022.  No recent known sick contacts with similar symptoms.  Cough is productive.  Denies chest pain, shortness of breath, heart palpitations, leg swelling, orthopnea, nausea, vomiting, diarrhea, and fever/chills.  He is requesting a COVID-19 test today.  Current everyday cigarette smoker, denies other drug use.  Denies history of chronic respiratory problems.  Using over-the-counter medications with some relief of symptoms.   Cough   Past Medical History:  Diagnosis Date   Arthritis    Bradycardia    Heart murmur    Hypertension    Pituitary tumor     Patient Active Problem List   Diagnosis Date Noted   Adjustment disorder with mixed anxiety and depressed mood 04/28/2022   Discord with neighbors, lodgers and landlord 04/11/2022   Dyspnea on exertion 09/29/2020   Essential hypertension 09/29/2020   Hyperlipidemia 09/29/2020    Past Surgical History:  Procedure Laterality Date   HERNIA REPAIR         Home Medications    Prior to Admission medications   Medication Sig Start Date End Date Taking? Authorizing Provider  benzonatate (TESSALON) 100 MG capsule Take 1 capsule (100 mg total) by mouth every 8 (eight) hours. 11/02/22  Yes Carlisle Beers, FNP  amLODipine (NORVASC) 10 MG tablet Take 10 mg by mouth every morning.     [provider]  aspirin EC 81 MG tablet Take 81 mg by mouth daily.    [provider]  atorvastatin (LIPITOR) 20 MG tablet TAKE 1 TABLET(20 MG) BY MOUTH EVERY MORNING 10/21/22   Cleaver, Thomasene Ripple, NP  Buprenorphine HCl-Naloxone HCl 8-2 MG FILM Place 2 tablets under the tongue  daily.    [provider]  furosemide (LASIX) 20 MG tablet Take 1 tablet (20 mg total) by mouth daily. 07/15/20   Rancour, Jeannett Senior, MD  furosemide (LASIX) 40 MG tablet Take 40 mg by mouth 2 (two) times daily as needed. 10/09/21   [provider]  methocarbamol (ROBAXIN) 500 MG tablet Take 1 tablet (500 mg total) by mouth 2 (two) times daily. 10/07/21   Arthor Captain, PA-C  naproxen (NAPROSYN) 375 MG tablet Take 1 tablet (375 mg total) by mouth 2 (two) times daily with a meal. 10/07/21   Arthor Captain, PA-C  Potassium Chloride ER 20 MEQ TBCR Take 20 mEq by mouth 2 (two) times daily. 10/09/21   [provider]    Family History Family History  Problem Relation Age of Onset   Cancer Mother    Hypertension Mother    Diabetes Mother    Cancer Father     Social History Social History   Tobacco Use   Smoking status: Every Day    Current packs/day: 1.00    Average packs/day: 1 pack/day for 25.0 years (25.0 ttl pk-yrs)    Types: Cigarettes   Smokeless tobacco: Never  Vaping Use   Vaping status: Never Used  Substance Use Topics   Alcohol use: No   Drug use: No     Allergies   Sulfa antibiotics   Review of Systems Review of Systems  Respiratory:  Positive for cough.   Per HPI   Physical Exam Triage Vital Signs ED Triage Vitals [11/02/22 1318]  Encounter Vitals Group     BP 113/69     Systolic BP Percentile      Diastolic BP Percentile      Pulse Rate (!) 54     Resp 20     Temp 98.2 F (36.8 C)     Temp Source Oral     SpO2 98 %     Weight      Height      Head Circumference      Peak Flow      Pain Score 0     Pain Loc      Pain Education      Exclude from Growth Chart    No data found.  Updated Vital Signs BP 113/69 (BP Location: Right Arm)   Pulse (!) 54   Temp 98.2 F (36.8 C) (Oral)   Resp 20   SpO2 98%   Visual Acuity Right Eye Distance:   Left Eye Distance:   Bilateral Distance:    Right Eye Near:   Left Eye Near:     Bilateral Near:     Physical Exam Vitals and nursing note reviewed.  Constitutional:      Appearance: He is not ill-appearing or toxic-appearing.  HENT:     Head: Normocephalic and atraumatic.     Right Ear: Hearing, tympanic membrane, ear canal and external ear normal.     Left Ear: Hearing, tympanic membrane, ear canal and external ear normal.     Nose: Congestion present.     Mouth/Throat:     Lips: Pink.     Mouth: Mucous membranes are moist. No injury.     Tongue: No lesions. Tongue does not deviate from midline.     Palate: No mass and lesions.     Pharynx: Oropharynx is clear. Uvula midline. No pharyngeal swelling, oropharyngeal exudate, posterior oropharyngeal erythema or uvula swelling.     Tonsils: No tonsillar exudate or tonsillar abscesses.  Eyes:     General: Lids are normal. Vision grossly intact. Gaze aligned appropriately.     Extraocular Movements: Extraocular movements intact.     Conjunctiva/sclera: Conjunctivae normal.  Cardiovascular:     Rate and Rhythm: Normal rate and regular rhythm.     Heart sounds: Normal heart sounds, S1 normal and S2 normal.  Pulmonary:     Effort: Pulmonary effort is normal. No respiratory distress.     Breath sounds: Normal breath sounds and air entry. No wheezing, rhonchi or rales.  Chest:     Chest wall: No tenderness.  Musculoskeletal:     Cervical back: Neck supple.  Lymphadenopathy:     Cervical: No cervical adenopathy.  Skin:    General: Skin is warm and dry.     Capillary Refill: Capillary refill takes less than 2 seconds.     Findings: No rash.  Neurological:     General: No focal deficit present.     Mental Status: He is alert and oriented to person, place, and time. Mental status is at baseline.     Cranial Nerves: No dysarthria or facial asymmetry.  Psychiatric:        Mood and Affect: Mood normal.        Speech: Speech normal.        Behavior: Behavior normal.        Thought Content: Thought content normal.  Judgment: Judgment normal.      UC Treatments / Results  Labs (all labs ordered are listed, but only abnormal results are displayed) Labs Reviewed  SARS CORONAVIRUS 2 (TAT 6-24 HRS)    EKG   Radiology No results found.  Procedures Procedures (including critical care time)  Medications Ordered in UC Medications - No data to display  Initial Impression / Assessment and Plan / UC Course  I have reviewed the triage vital signs and the nursing notes.  Pertinent labs & imaging results that were available during my care of the patient were reviewed by me and considered in my medical decision making (see chart for details).   1.  Viral URI with cough Evaluation suggests viral URI etiology. Will manage this with recommendations for OTC and prescription medications for symptomatic relief.    Imaging: deferred based on stable cardiopulmonary exam/hemodynamically stable vital signs  COVID-19 testing is pending, patient is a candidate for antiviral therapy, may have molnupiravir if positive.  Counseled patient on potential for adverse effects with medications prescribed/recommended today, strict ER and return-to-clinic precautions discussed, patient verbalized understanding.    Final Clinical Impressions(s) / UC Diagnoses   Final diagnoses:  Viral URI with cough  Cigarette nicotine dependence without complication     Discharge Instructions      Your symptoms are most likely due to a viral illness, which will improve on its own with rest and fluids. COVID testing is pending, staff will call you if this is positive. Wear a mask for 5 days of symptoms while you are in public, then you may remove your mask. You may go back to work if you do not have a fever for 24 hours without any medicines.  - Take prescribed medicines to help with symptoms: tessalon perles - Use over the counter medicines to help with symptoms as discussed: Tylenol, guaifenesin (mucinex), zyrtec,  etc - Two teaspoons of honey in warm water every 4-6 hours may help with throat pains - Humidifier in your room at night to help add water the air and soothe cough  If you develop any new or worsening symptoms or do not improve in the next 2 to 3 days, please return.  If your symptoms are severe, please go to the emergency room.  Follow-up with PCP as needed.    ED Prescriptions     Medication Sig Dispense Auth. Provider   benzonatate (TESSALON) 100 MG capsule Take 1 capsule (100 mg total) by mouth every 8 (eight) hours. 21 capsule Carlisle Beers, FNP      PDMP not reviewed this encounter.   Carlisle Beers, [place] 11/02/22 [phone]

## 2022-11-03 ENCOUNTER — Encounter (HOSPITAL_COMMUNITY): Payer: Self-pay | Admitting: Internal Medicine

## 2022-11-03 ENCOUNTER — Telehealth (HOSPITAL_COMMUNITY): Payer: Self-pay | Admitting: Internal Medicine

## 2022-11-03 DIAGNOSIS — U071 COVID-19: Secondary | ICD-10-CM

## 2022-11-03 MED ORDER — MOLNUPIRAVIR EUA 200MG CAPSULE: 4.0000 | ORAL_CAPSULE | Freq: Two times a day (BID) | ORAL | 0 refills | Status: AC

## 2022-11-03 NOTE — Telephone Encounter (Signed)
COVID test is positive.  Molnupiravir sent.

## 2022-11-11 NOTE — Progress Notes (Unsigned)
[facility]   Telephone:([phone] Fax:(336) [phone]   Clinic New Consult Note   Patient Care Team: [facility] as PCP - General (Internal Medicine) Runell Gess, MD as PCP - Cardiology (Cardiology) 11/12/2022  CHIEF COMPLAINTS/PURPOSE OF CONSULTATION:  Anemia   REFERRING PHYSICIAN: Gus Height, PA   HISTORY OF PRESENTING ILLNESS:  [name] 67 y.o. male is here because of anemia.  He was referred by his primary care physician, he presents to the clinic with his significant other.  He has had a mild anemia for over 10 years.  His lab records in our system showed a mild anemia with hemoglobin 11-12.5 since 2012, he is reading the CBC with his primary care physician outside showed hemoglobin 11.5 in May 2024, and 11.3 in early August 2024.  MCV has been normal.  The rest of CBC was unremarkable.  CMP unremarkable.  He has mild fatigue, no chest pain or other discomfort.  No recent weight loss, night sweats, or change of appetite.  He is a retired [employer] architect.  He had anemia workup in 2012, which showed a normal iron study, B12 and folate level.  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  Per patient he has low testosterone level, but is not on replacement.  He is a heavy smoker, 1 pack a day, for the past 25 years, still actively smoking.  He used to crack cocaine, for which he is on Suboxone.  He denies opiate abuse or intravenous drugs.  He used to drink alcohol when he was young for 5 to 7 years, but stopped in his 37s.  MEDICAL HISTORY:  Past Medical History:  Diagnosis Date   Arthritis    Bradycardia    Heart murmur    Hypertension    Pituitary tumor     SURGICAL HISTORY: Past Surgical History:  Procedure Laterality Date   HERNIA REPAIR      SOCIAL HISTORY: Social History   Socioeconomic History   Marital status: Single    Spouse name: Not on file   Number of children: Not on  file   Years of education: Not on file   Highest education level: Not on file  Occupational History   Not on file  Tobacco Use   Smoking status: Every Day    Current packs/day: 1.00    Average packs/day: 1 pack/day for 25.0 years (25.0 ttl pk-yrs)    Types: Cigarettes   Smokeless tobacco: Never  Vaping Use   Vaping status: Never Used  Substance and Sexual Activity   Alcohol use: No    Comment: used to drink alcohol for 5 yeaars when young   Drug use: Not Currently    Types: "Crack" cocaine   Sexual activity: Not on file  Other Topics Concern   Not on file  Social History Narrative   Not on file   Social Determinants of Health   Financial Resource Strain: High Risk (04/28/2022)   Overall Financial Resource Strain (CARDIA)    Difficulty of Paying Living Expenses: Very hard  Food Insecurity: Food Insecurity Present (05/13/2022)   Hunger Vital Sign    Worried About Running Out of Food in the Last Year: Sometimes true    Ran Out of Food in the Last Year: Sometimes true  Transportation Needs: No Transportation Needs (04/28/2022)   PRAPARE - Administrator, Civil Service (Medical): No    Lack of Transportation (Non-Medical):  No  Physical Activity: Sufficiently Active (04/28/2022)   Exercise Vital Sign    Days of Exercise per Week: 7 days    Minutes of Exercise per Session: 60 min  Stress: Stress Concern Present (04/28/2022)   [employer] of Occupational Health - Occupational Stress Questionnaire    Feeling of Stress : Very much  Social Connections: Socially Isolated (04/28/2022)   Social Connection and Isolation Panel [NHANES]    Frequency of Communication with Friends and Family: More than three times a week    Frequency of Social Gatherings with Friends and Family: Twice a week    Attends Religious Services: Never    Database administrator or Organizations: No    Attends Banker Meetings: Never    Marital Status: Divorced  Catering manager  Violence: Not At Risk (04/28/2022)   Humiliation, Afraid, Rape, and Kick questionnaire    Fear of Current or Ex-Partner: No    Emotionally Abused: No    Physically Abused: No    Sexually Abused: No    FAMILY HISTORY: Family History  Problem Relation Age of Onset   Cancer Mother        Unknown type   Hypertension Mother    Diabetes Mother    Cancer Father        lung cancer    ALLERGIES:  is allergic to sulfa antibiotics.  MEDICATIONS:  Current Outpatient Medications  Medication Sig Dispense Refill   amLODipine (NORVASC) 10 MG tablet Take 10 mg by mouth every morning.      aspirin EC 81 MG tablet Take 81 mg by mouth daily.     atorvastatin (LIPITOR) 20 MG tablet TAKE 1 TABLET(20 MG) BY MOUTH EVERY MORNING 90 tablet 1   Buprenorphine HCl-Naloxone HCl 8-2 MG FILM Place 2 tablets under the tongue daily.     furosemide (LASIX) 20 MG tablet Take 1 tablet (20 mg total) by mouth daily. 10 tablet 0   furosemide (LASIX) 40 MG tablet Take 40 mg by mouth 2 (two) times daily as needed.     methocarbamol (ROBAXIN) 500 MG tablet Take 1 tablet (500 mg total) by mouth 2 (two) times daily. 20 tablet 0   naproxen (NAPROSYN) 375 MG tablet Take 1 tablet (375 mg total) by mouth 2 (two) times daily with a meal. 20 tablet 0   Potassium Chloride ER 20 MEQ TBCR Take 20 mEq by mouth 2 (two) times daily.     benzonatate (TESSALON) 100 MG capsule Take 1 capsule (100 mg total) by mouth every 8 (eight) hours. (Patient not taking: Reported on 11/12/2022) 21 capsule 0   No current facility-administered medications for this visit.    REVIEW OF SYSTEMS:   Constitutional: Denies fevers, chills or abnormal night sweats Eyes: Denies blurriness of vision, double vision or watery eyes Ears, nose, mouth, throat, and face: Denies mucositis or sore throat Respiratory: Denies cough, dyspnea or wheezes Cardiovascular: Denies palpitation, chest discomfort or lower extremity swelling Gastrointestinal:  Denies nausea,  heartburn or change in bowel habits Skin: Denies abnormal skin rashes Lymphatics: Denies new lymphadenopathy or easy bruising Neurological:Denies numbness, tingling or new weaknesses Behavioral/Psych: Mood is stable, no new changes  All other systems were reviewed with the patient and are negative.  PHYSICAL EXAMINATION: ECOG PERFORMANCE STATUS: 0 - Asymptomatic  Vitals:   11/12/22 0956  BP: 124/76  Pulse: (!) 48  Resp: 16  Temp: (!) 97.5 F (36.4 C)  SpO2: 100%   There were no vitals filed for  this visit.  GENERAL:alert, no distress and comfortable SKIN: skin color, texture, turgor are normal, no rashes or significant lesions EYES: normal, conjunctiva are pink and non-injected, sclera clear OROPHARYNX:no exudate, no erythema and lips, buccal mucosa, and tongue normal  NECK: supple, thyroid normal size, non-tender, without nodularity LYMPH:  no palpable lymphadenopathy in the cervical, axillary or inguinal LUNGS: clear to auscultation and percussion with normal breathing effort HEART: regular rate & rhythm and no murmurs and no lower extremity edema ABDOMEN:abdomen soft, non-tender and normal bowel sounds Musculoskeletal:no cyanosis of digits and no clubbing  PSYCH: alert & oriented x 3 with fluent speech NEURO: no focal motor/sensory deficits  LABORATORY DATA:  I have reviewed the data as listed    Latest Ref Rng & Units 03/03/2022   10:17 AM 05/12/2021    5:40 PM 07/15/2020   12:12 AM  CBC  WBC 4.0 - 10.5 K/uL 6.1  6.4  5.6   Hemoglobin 13.0 - 17.0 g/dL 08.6  57.8  46.9   Hematocrit 39.0 - 52.0 % 34.6  37.7  36.2   Platelets 150 - 400 K/uL 243  218  210       Latest Ref Rng & Units 03/24/2022   12:06 PM 03/03/2022   10:18 AM 05/12/2021    5:40 PM  CMP  Glucose 70 - 99 mg/dL  93  83   BUN 8 - 23 mg/dL  15  11   Creatinine 6.29 - 1.24 mg/dL  5.28  4.13   Sodium 244 - 145 mmol/L  136  138   Potassium 3.5 - 5.1 mmol/L  3.1  3.3   Chloride 98 - 111 mmol/L  105  104    CO2 22 - 32 mmol/L  25  28   Calcium 8.9 - 10.3 mg/dL  9.1  8.9   Total Protein 6.0 - 8.5 g/dL 7.1  7.0  7.7   Total Bilirubin 0.0 - 1.2 mg/dL 0.4  0.4  0.2   Alkaline Phos 44 - 121 IU/L 50  45  41   AST 0 - 40 IU/L 25  25  27    ALT 0 - 44 IU/L 11  13  15       RADIOGRAPHIC STUDIES: I have personally reviewed the radiological images as listed and agreed with the findings in the report. No results found.  ASSESSMENT & PLAN:  67 year old male with past medical history of hypertension, dyslipidemia, drug abuse, heavy smoker, presented with mild anemia  Normocytic anemia -It has been ongoing for over 10 years, hemoglobin fluctuates in between 10.5-12.5, previous anemia workup showed normal iron, B12 and folate in 2012. -Anemia slightly worse lately with hemoglobin in the range of 11.3-11.5, normocytic, the rest of CBC unremarkable. -We discussed, etiology of anemia, including inheritable anemia such as sickle cell disease, nutritional anemia, anemia of chronic disease from medical condition, chronic inflammation, alcohol, chronic virus infection especially hepatitis and HIV, hypotestosteronemia, primary bone marrow disease such as MDS and multiple myeloma, or medication induced anemia. -Will obtain anemia lab work today, including iron study, B12, folate, heavy metal, reticulocyte count, LDH, SPEP with immunofixation, hemoglobin electrophoresis, potassium level, hepatitis panel, HIV -I will call him with the lab results in 2 to 3 weeks, and decide if he needs a bone marrow or not.  Given the long history of anemia and mild anemia overall, he probably does not need a bone marrow biopsy.  2.  Substance abuse -He is a heavy smoker, still actively smoking, and previous  history of drug abuse, currently on Suboxone -I strongly encouraged him to quit smoking, he previously tried nicotine patch, I encouraged him to discuss with his primary care physician with [facility]  3.  Pituitary tumor -Will  follow-up with his neurosurgeon and the plan to have her surgery.  Plan -Anemia lab work today -Meagan Ancona visit in 3 weeks to review results, and determine next step about monitoring, and treatment.   Orders Placed This Encounter  Procedures   CBC with Differential (Cancer Center Only)    Standing Status:   Future    Standing Expiration Date:   11/12/2023   Iron and Iron Binding Capacity ([facility] only)    Standing Status:   Future    Standing Expiration Date:   11/12/2023   Ferritin    Standing Status:   Future    Standing Expiration Date:   11/12/2023   Retic Panel    Standing Status:   Future    Standing Expiration Date:   11/12/2023   Technologist smear review    Order Specific Question:   Clinical information:    Answer:   anemia   Lactate dehydrogenase (LDH)    Standing Status:   Future    Standing Expiration Date:   11/12/2023   Vitamin B12    Standing Status:   Future    Standing Expiration Date:   11/12/2023   Folate, Serum    Standing Status:   Future    Standing Expiration Date:   11/12/2023   SPEP with reflex to IFE    Standing Status:   Future    Standing Expiration Date:   11/12/2023   Heavy metals, blood    Standing Status:   Future    Standing Expiration Date:   11/12/2023    Order Specific Question:   [place] of residence?    Answer:   [place] [[id number]]   Hgb Fractionation [place]    Standing Status:   Future    Standing Expiration Date:   11/12/2023   Testosterone    Standing Status:   Future    Standing Expiration Date:   11/12/2023   Hepatitis panel, acute    Standing Status:   Future    Standing Expiration Date:   11/12/2023   HIV antibody (with reflex)    Standing Status:   Future    Standing Expiration Date:   11/12/2023    All questions were answered. The patient knows to call the clinic with any problems, questions or concerns. I spent 35 minutes counseling the patient face to face. The total time spent in the appointment was 45 minutes and more than 50%  was on counseling.     Malachy Mood, MD 11/12/2022 10:25 AM

## 2022-11-12 ENCOUNTER — Inpatient Hospital Stay: Payer: 59 | Attending: Hematology | Admitting: Hematology

## 2022-11-12 ENCOUNTER — Other Ambulatory Visit: Payer: Self-pay

## 2022-11-12 ENCOUNTER — Inpatient Hospital Stay: Payer: 59

## 2022-11-12 ENCOUNTER — Encounter: Payer: Self-pay | Admitting: Hematology

## 2022-11-12 VITALS — BP 124/76 | HR 48 | Temp 97.5°F | Resp 16

## 2022-11-12 DIAGNOSIS — I10 Essential (primary) hypertension: Secondary | ICD-10-CM | POA: Insufficient documentation

## 2022-11-12 DIAGNOSIS — D497 Neoplasm of unspecified behavior of endocrine glands and other parts of nervous system: Secondary | ICD-10-CM | POA: Insufficient documentation

## 2022-11-12 DIAGNOSIS — Z79899 Other long term (current) drug therapy: Secondary | ICD-10-CM | POA: Insufficient documentation

## 2022-11-12 DIAGNOSIS — D649 Anemia, unspecified: Secondary | ICD-10-CM | POA: Insufficient documentation

## 2022-11-12 DIAGNOSIS — Z7982 Long term (current) use of aspirin: Secondary | ICD-10-CM | POA: Diagnosis not present

## 2022-11-12 DIAGNOSIS — R5383 Other fatigue: Secondary | ICD-10-CM | POA: Insufficient documentation

## 2022-11-12 DIAGNOSIS — F17210 Nicotine dependence, cigarettes, uncomplicated: Secondary | ICD-10-CM | POA: Diagnosis not present

## 2022-11-12 DIAGNOSIS — Z791 Long term (current) use of non-steroidal anti-inflammatories (NSAID): Secondary | ICD-10-CM | POA: Diagnosis not present

## 2022-11-12 LAB — IRON AND IRON BINDING CAPACITY (CC-WL,HP ONLY)
Iron: 82 ug/dL (ref 45–182)
Saturation Ratios: 24 % (ref 17.9–39.5)
TIBC: 346 ug/dL (ref 250–450)
UIBC: 264 ug/dL (ref 117–376)

## 2022-11-12 LAB — CBC WITH DIFFERENTIAL (CANCER CENTER ONLY)
Abs Immature Granulocytes: 0.05 10*3/uL (ref 0.00–0.07)
Basophils Absolute: 0 10*3/uL (ref 0.0–0.1)
Basophils Relative: 0 %
Eosinophils Absolute: 0.2 10*3/uL (ref 0.0–0.5)
Eosinophils Relative: 3 %
HCT: 32.7 % — ABNORMAL LOW (ref 39.0–52.0)
Hemoglobin: 11.2 g/dL — ABNORMAL LOW (ref 13.0–17.0)
Immature Granulocytes: 1 %
Lymphocytes Relative: 36 %
Lymphs Abs: 2.5 10*3/uL (ref 0.7–4.0)
MCH: 28.9 pg (ref 26.0–34.0)
MCHC: 34.3 g/dL (ref 30.0–36.0)
MCV: 84.5 fL (ref 80.0–100.0)
Monocytes Absolute: 0.6 10*3/uL (ref 0.1–1.0)
Monocytes Relative: 9 %
Neutro Abs: 3.5 10*3/uL (ref 1.7–7.7)
Neutrophils Relative %: 51 %
Platelet Count: 215 10*3/uL (ref 150–400)
RBC: 3.87 MIL/uL — ABNORMAL LOW (ref 4.22–5.81)
RDW: 13.2 % (ref 11.5–15.5)
WBC Count: 6.9 10*3/uL (ref 4.0–10.5)
nRBC: 0 % (ref 0.0–0.2)

## 2022-11-12 LAB — TECHNOLOGIST SMEAR REVIEW: Plt Morphology: NORMAL

## 2022-11-12 LAB — FOLATE: Folate: 6.1 ng/mL (ref >5.9)

## 2022-11-12 LAB — HEPATITIS PANEL, ACUTE
HCV Ab: NONREACTIVE
Hep A IgM: NONREACTIVE
Hep B C IgM: NONREACTIVE
Hepatitis B Surface Ag: NONREACTIVE

## 2022-11-12 LAB — LACTATE DEHYDROGENASE: LDH: 204 U/L — ABNORMAL HIGH (ref 98–192)

## 2022-11-12 LAB — RETIC PANEL
Immature Retic Fract: 9.4 % (ref 2.3–15.9)
RBC.: 3.86 MIL/uL — ABNORMAL LOW (ref 4.22–5.81)
Retic Count, Absolute: 40.5 10*3/uL (ref 19.0–186.0)
Retic Ct Pct: 1.1 % (ref 0.4–3.1)
Reticulocyte Hemoglobin: 33.2 pg (ref >27.9)

## 2022-11-12 LAB — HIV ANTIBODY (ROUTINE TESTING W REFLEX): HIV Screen 4th Generation wRfx: NONREACTIVE

## 2022-11-12 LAB — VITAMIN B12: Vitamin B-12: 225 pg/mL (ref 180–914)

## 2022-11-12 LAB — FERRITIN: Ferritin: 50 ng/mL (ref 24–336)

## 2022-11-15 LAB — TESTOSTERONE: Testosterone: 48 ng/dL — ABNORMAL LOW (ref 264–916)

## 2022-11-16 LAB — PROTEIN ELECTROPHORESIS, SERUM, WITH REFLEX
A/G Ratio: 1.4 (ref 0.7–1.7)
Albumin ELP: 4 g/dL (ref 2.9–4.4)
Alpha-1-Globulin: 0.2 g/dL (ref 0.0–0.4)
Alpha-2-Globulin: 0.6 g/dL (ref 0.4–1.0)
Beta Globulin: 0.8 g/dL (ref 0.7–1.3)
Gamma Globulin: 1.2 g/dL (ref 0.4–1.8)
Globulin, Total: 2.8 g/dL (ref 2.2–3.9)
Total Protein ELP: 6.8 g/dL (ref 6.0–8.5)

## 2022-11-16 LAB — HEAVY METALS, BLOOD
Arsenic: 3 ug/L (ref 0–9)
Lead: 1.1 ug/dL (ref 0.0–3.4)
Mercury: <1 ug/L (ref 0.0–14.9)

## 2022-11-16 LAB — HGB FRACTIONATION CASCADE
Hgb A2: 2.7 % (ref 1.8–3.2)
Hgb A: 97.3 % (ref 96.4–98.8)
Hgb F: 0 % (ref 0.0–2.0)
Hgb S: 0 %

## 2022-11-25 NOTE — Progress Notes (Unsigned)
[facility]   Telephone:(336) [phone] Fax:(336) [phone]   Clinic Follow up Note   Patient Care Team: [facility] as PCP - General (Internal Medicine) Runell Gess, MD as PCP - Cardiology (Cardiology)  Date of Service:  11/28/2022  I connected with [name] on 11/28/2022 at  9:40 AM EDT by telephone visit and verified that I am speaking with the correct person using two identifiers.  I discussed the limitations, risks, security and privacy concerns of performing an evaluation and management service by telephone and the availability of in person appointments. I also discussed with the patient that there may be a patient responsible charge related to this service. The patient expressed understanding and agreed to proceed.   Other persons participating in the visit and their role in the encounter:  No  Patient's location:  Car Provider's location:  [facility] Office  CHIEF COMPLAINT: f/u of Anemia     CURRENT THERAPY:    ASSESSMENT & PLAN:  [name] is a 67 y.o. male with    Anemia It has been ongoing for over 10 years, hemoglobin fluctuates in between 10.5-12.5, previous anemia workup showed normal iron, B12 and folate in 2012. -Anemia slightly worse lately with hemoglobin in the range of 11.3-11.5, normocytic, with low reticulocyte count.  No evidence of hemolysis. -Anemia workup in August 2024 showed normal iron, slightly low B12 at 225, and significantly low testosterone level, which likely contributed to his anemia.  SPEP was negative for M protein, hepatitis, HIV, were all negative, hemoglobin electrophoresis was also normal. -I recommend him to start oral B12 1000 mcg daily, and encouraged him to discuss with his primary care physician about testosterone replacement. -Given mild anemia, and chronic nature, my suspicion for primary pulmonary disease such as MDS is low, I do not feel he needs a bone marrow biopsy at this point. -Continue  monitoring.  PLAN: -Lab reviewed-B12 low - I recommend Oral B12  supplement -I encouraged him to discuss Testosterone replacement with his PCP -repeat lab in 3 months and a f/u  one week after lab    INTERVAL HISTORY:  [name] was contacted for a follow up of Anemia . He was last seen by me on 11/12/2022.    All other systems were reviewed with the patient and are negative.  MEDICAL HISTORY:  Past Medical History:  Diagnosis Date   Arthritis    Bradycardia    Heart murmur    Hypertension    Pituitary tumor     SURGICAL HISTORY: Past Surgical History:  Procedure Laterality Date   HERNIA REPAIR      I have reviewed the social history and family history with the patient and they are unchanged from previous note.  ALLERGIES:  is allergic to sulfa antibiotics.  MEDICATIONS:  Current Outpatient Medications  Medication Sig Dispense Refill   amLODipine (NORVASC) 10 MG tablet Take 10 mg by mouth every morning.      aspirin EC 81 MG tablet Take 81 mg by mouth daily.     atorvastatin (LIPITOR) 20 MG tablet TAKE 1 TABLET(20 MG) BY MOUTH EVERY MORNING 90 tablet 1   benzonatate (TESSALON) 100 MG capsule Take 1 capsule (100 mg total) by mouth every 8 (eight) hours. (Patient not taking: Reported on 11/12/2022) 21 capsule 0   Buprenorphine HCl-Naloxone HCl 8-2 MG FILM Place 2 tablets under the tongue daily.     furosemide (LASIX) 20 MG tablet Take 1 tablet (20 mg  total) by mouth daily. 10 tablet 0   furosemide (LASIX) 40 MG tablet Take 40 mg by mouth 2 (two) times daily as needed.     methocarbamol (ROBAXIN) 500 MG tablet Take 1 tablet (500 mg total) by mouth 2 (two) times daily. 20 tablet 0   naproxen (NAPROSYN) 375 MG tablet Take 1 tablet (375 mg total) by mouth 2 (two) times daily with a meal. 20 tablet 0   Potassium Chloride ER 20 MEQ TBCR Take 20 mEq by mouth 2 (two) times daily.     No current facility-administered medications for this visit.    PHYSICAL EXAMINATION: ECOG  PERFORMANCE STATUS: 1 - Symptomatic but completely ambulatory  There were no vitals filed for this visit. Wt Readings from Last 3 Encounters:  05/08/22 189 lb (85.7 kg)  03/24/22 191 lb 9.6 oz (86.9 kg)  03/03/22 192 lb 6.4 oz (87.3 kg)     No vitals taken today, Exam not performed today  LABORATORY DATA:  I have reviewed the data as listed    Latest Ref Rng & Units 11/12/2022   10:39 AM 03/03/2022   10:17 AM 05/12/2021    5:40 PM  CBC  WBC 4.0 - 10.5 K/uL 6.9  6.1  6.4   Hemoglobin 13.0 - 17.0 g/dL 95.2  84.1  32.4   Hematocrit 39.0 - 52.0 % 32.7  34.6  37.7   Platelets 150 - 400 K/uL 215  243  218         Latest Ref Rng & Units 03/24/2022   12:06 PM 03/03/2022   10:18 AM 05/12/2021    5:40 PM  CMP  Glucose 70 - 99 mg/dL  93  83   BUN 8 - 23 mg/dL  15  11   Creatinine 4.01 - 1.24 mg/dL  0.27  2.53   Sodium 664 - 145 mmol/L  136  138   Potassium 3.5 - 5.1 mmol/L  3.1  3.3   Chloride 98 - 111 mmol/L  105  104   CO2 22 - 32 mmol/L  25  28   Calcium 8.9 - 10.3 mg/dL  9.1  8.9   Total Protein 6.0 - 8.5 g/dL 7.1  7.0  7.7   Total Bilirubin 0.0 - 1.2 mg/dL 0.4  0.4  0.2   Alkaline Phos 44 - 121 IU/L 50  45  41   AST 0 - 40 IU/L 25  25  27    ALT 0 - 44 IU/L 11  13  15        RADIOGRAPHIC STUDIES: I have personally reviewed the radiological images as listed and agreed with the findings in the report. No results found.    Orders Placed This Encounter  Procedures   CBC with Differential/Platelet    Standing Status:   Standing    Number of Occurrences:   50    Standing Expiration Date:   11/28/2023   Vitamin B12    Standing Status:   Standing    Number of Occurrences:   5    Standing Expiration Date:   11/28/2023   Testosterone    Standing Status:   Future    Standing Expiration Date:   11/28/2023   All questions were answered. The patient knows to call the clinic with any problems, questions or concerns. No barriers to learning was detected. The total time spent in  the appointment was 15 minutes.     Malachy Mood, MD 11/28/2022   Carolin Coy am acting  as scribe for Malachy Mood, MD.   I have reviewed the above documentation for accuracy and completeness, and I agree with the above.

## 2022-11-27 NOTE — Assessment & Plan Note (Signed)
It has been ongoing for over 10 years, hemoglobin fluctuates in between 10.5-12.5, previous anemia workup showed normal iron, B12 and folate in 2012. -Anemia slightly worse lately with hemoglobin in the range of 11.3-11.5, normocytic, with low reticulocyte count.  No evidence of hemolysis. -Anemia workup in August 2024 showed normal iron, slightly low B12 at 225, and significantly low testosterone level, which likely contributed to his anemia.  SPEP was negative for M protein, hepatitis, HIV, were all negative, hemoglobin electrophoresis was also normal. -I recommend him to start oral B12 1000 mcg daily, and discussed with his primary care physician about testosterone replacement. -Given mild anemia, and chronic nature, my suspicion for primary pulmonary disease such as MDS is low, I do not feel he needs a bone marrow biopsy at this point. -Continue monitoring.

## 2022-11-28 ENCOUNTER — Inpatient Hospital Stay: Payer: 59 | Attending: Hematology | Admitting: Hematology

## 2022-11-28 ENCOUNTER — Encounter: Payer: Self-pay | Admitting: Hematology

## 2022-11-28 DIAGNOSIS — D649 Anemia, unspecified: Secondary | ICD-10-CM | POA: Diagnosis not present

## 2022-11-28 DIAGNOSIS — E349 Endocrine disorder, unspecified: Secondary | ICD-10-CM | POA: Diagnosis not present

## 2022-11-29 ENCOUNTER — Telehealth: Payer: Self-pay | Admitting: Hematology

## 2023-02-20 ENCOUNTER — Inpatient Hospital Stay: Payer: 59 | Attending: Hematology

## 2023-02-27 ENCOUNTER — Inpatient Hospital Stay: Payer: 59 | Admitting: Hematology

## 2023-02-27 ENCOUNTER — Telehealth: Payer: Self-pay

## 2023-02-27 NOTE — Telephone Encounter (Signed)
Called pt and spouse at telephone numbers listed.  Unforturnately, unable to reach pt nor spouse.  Unable to LVM for both.  Cancelled appt today per Dr. Mosetta Putt d/t pt NO Showed lab appt last week.  Sent scheduling message to Patricia Nettle to get pt rescheduled for lab and office visit.

## 2023-03-01 ENCOUNTER — Telehealth: Payer: Self-pay | Admitting: Hematology

## 2023-03-21 ENCOUNTER — Inpatient Hospital Stay: Payer: 59 | Attending: Hematology

## 2023-03-27 ENCOUNTER — Other Ambulatory Visit: Payer: Self-pay | Admitting: Nurse Practitioner

## 2023-03-27 DIAGNOSIS — D649 Anemia, unspecified: Secondary | ICD-10-CM

## 2023-03-27 NOTE — Assessment & Plan Note (Deleted)
 It has been ongoing for over 10 years, hemoglobin fluctuates in between 10.5-12.5, previous anemia workup showed normal iron, B12 and folate in 2012. -Anemia slightly worse lately with hemoglobin in the range of 11.3-11.5, normocytic, with low reticulocyte count.  No evidence of hemolysis. -Anemia workup in August 2024 showed normal iron, slightly low B12 at 225, and significantly low testosterone  level, which likely contributed to his anemia.  SPEP was negative for M protein, hepatitis, HIV, were all negative, hemoglobin electrophoresis was also normal. -Ir was recommended he start oral B12 1000 mcg daily, and discussed with his primary care physician about testosterone  replacement. -Given mild anemia, and chronic nature, suspicion for primary pulmonary disease such as MDS is low, it is not felt he needs a bone marrow biopsy at this point. -Continue monitoring.

## 2023-03-27 NOTE — Progress Notes (Deleted)
 Patient Care Team: [facility] as PCP - General (Internal Medicine) Court Dorn PARAS, MD as PCP - Cardiology (Cardiology)  Clinic Day:  03/27/2023  Referring physician: [facility]  ASSESSMENT & PLAN:   Assessment & Plan: Anemia It has been ongoing for over 10 years, hemoglobin fluctuates in between 10.5-12.5, previous anemia workup showed normal iron, B12 and folate in 2012. -Anemia slightly worse lately with hemoglobin in the range of 11.3-11.5, normocytic, with low reticulocyte count.  No evidence of hemolysis. -Anemia workup in August 2024 showed normal iron, slightly low B12 at 225, and significantly low testosterone  level, which likely contributed to his anemia.  SPEP was negative for M protein, hepatitis, HIV, were all negative, hemoglobin electrophoresis was also normal. -Ir was recommended he start oral B12 1000 mcg daily, and discussed with his primary care physician about testosterone  replacement. -Given mild anemia, and chronic nature, suspicion for primary pulmonary disease such as MDS is low, it is not felt he needs a bone marrow biopsy at this point. -Continue monitoring.    The patient understands the plans discussed today and is in agreement with them.  He knows to contact our office if he develops concerns prior to his next appointment.  I provided *** minutes of face-to-face time during this encounter and > 50% was spent counseling as documented under my assessment and plan.    Powell FORBES Lessen, NP  [facility] - A DEPT OF [facility] [address] [place] [address] Dept: [phone] Dept Fax: [phone]   No orders of the defined types were placed in this encounter.     CHIEF COMPLAINT:  CC: anemia  Current Treatment: Oral vitamin B12 supplements  INTERVAL HISTORY:  [name] is here today for repeat clinical assessment.  He was last seen by Dr. Lanny on  11/28/2022.  Labs showed vitamin B12 level low.  Was encouraged to take B12 supplement.  He denies fevers or chills. He denies pain. His appetite is good. His weight {Weight change:10426}.  I have reviewed the past medical history, past surgical history, social history and family history with the patient and they are unchanged from previous note.  ALLERGIES:  is allergic to sulfa antibiotics.  MEDICATIONS:  Current Outpatient Medications  Medication Sig Dispense Refill   amLODipine  (NORVASC ) 10 MG tablet Take 10 mg by mouth every morning.      aspirin  EC 81 MG tablet Take 81 mg by mouth daily.     atorvastatin  (LIPITOR) 20 MG tablet TAKE 1 TABLET(20 MG) BY MOUTH EVERY MORNING 90 tablet 1   benzonatate  (TESSALON ) 100 MG capsule Take 1 capsule (100 mg total) by mouth every 8 (eight) hours. (Patient not taking: Reported on 11/12/2022) 21 capsule 0   Buprenorphine HCl-Naloxone HCl 8-2 MG FILM Place 2 tablets under the tongue daily.     furosemide  (LASIX ) 20 MG tablet Take 1 tablet (20 mg total) by mouth daily. 10 tablet 0   furosemide  (LASIX ) 40 MG tablet Take 40 mg by mouth 2 (two) times daily as needed.     methocarbamol  (ROBAXIN ) 500 MG tablet Take 1 tablet (500 mg total) by mouth 2 (two) times daily. 20 tablet 0   naproxen  (NAPROSYN ) 375 MG tablet Take 1 tablet (375 mg total) by mouth 2 (two) times daily with a meal. 20 tablet 0   Potassium Chloride  ER 20 MEQ TBCR Take 20 mEq by mouth 2 (two) times daily.  No current facility-administered medications for this visit.     REVIEW OF SYSTEMS:   Constitutional: Denies fevers, chills or abnormal weight loss Eyes: Denies blurriness of vision Ears, nose, mouth, throat, and face: Denies mucositis or sore throat Respiratory: Denies cough, dyspnea or wheezes Cardiovascular: Denies palpitation, chest discomfort or lower extremity swelling Gastrointestinal:  Denies nausea, heartburn or change in bowel habits Skin: Denies abnormal skin  rashes Lymphatics: Denies new lymphadenopathy or easy bruising Neurological:Denies numbness, tingling or new weaknesses Behavioral/Psych: Mood is stable, no new changes  All other systems were reviewed with the patient and are negative.   VITALS:  There were no vitals taken for this visit.  Wt Readings from Last 3 Encounters:  05/08/22 189 lb (85.7 kg)  03/24/22 191 lb 9.6 oz (86.9 kg)  03/03/22 192 lb 6.4 oz (87.3 kg)    There is no height or weight on file to calculate BMI.  Performance status (ECOG): {CHL ONC [id number]  PHYSICAL EXAM:   GENERAL:alert, no distress and comfortable SKIN: skin color, texture, turgor are normal, no rashes or significant lesions EYES: normal, Conjunctiva are pink and non-injected, sclera clear OROPHARYNX:no exudate, no erythema and lips, buccal mucosa, and tongue normal  NECK: supple, thyroid  normal size, non-tender, without nodularity LYMPH:  no palpable lymphadenopathy in the cervical, axillary or inguinal LUNGS: clear to auscultation and percussion with normal breathing effort HEART: regular rate & rhythm and no murmurs and no lower extremity edema ABDOMEN:abdomen soft, non-tender and normal bowel sounds Musculoskeletal:no cyanosis of digits and no clubbing  NEURO: alert & oriented x 3 with fluent speech, no focal motor/sensory deficits  LABORATORY DATA:  I have reviewed the data as listed    Component Value Date/Time   NA 136 03/03/2022 1018   K 3.1 (L) 03/03/2022 1018   CL 105 03/03/2022 1018   CO2 25 03/03/2022 1018   GLUCOSE 93 03/03/2022 1018   BUN 15 03/03/2022 1018   CREATININE 0.94 03/03/2022 1018   CALCIUM  9.1 03/03/2022 1018   PROT 7.1 03/24/2022 1206   ALBUMIN 4.6 03/24/2022 1206   AST 25 03/24/2022 1206   ALT 11 03/24/2022 1206   ALKPHOS 50 03/24/2022 1206   BILITOT 0.4 03/24/2022 1206   GFRNONAA >60 03/03/2022 1018   GFRAA >60 07/16/2019 0522    Lab Results  Component Value Date   SPEP Comment 11/12/2022     Lab Results  Component Value Date   WBC 6.9 11/12/2022   NEUTROABS 3.5 11/12/2022   HGB 11.2 (L) 11/12/2022   HCT 32.7 (L) 11/12/2022   MCV 84.5 11/12/2022   PLT 215 11/12/2022      Chemistry      Component Value Date/Time   NA 136 03/03/2022 1018   K 3.1 (L) 03/03/2022 1018   CL 105 03/03/2022 1018   CO2 25 03/03/2022 1018   BUN 15 03/03/2022 1018   CREATININE 0.94 03/03/2022 1018      Component Value Date/Time   CALCIUM  9.1 03/03/2022 1018   ALKPHOS 50 03/24/2022 1206   AST 25 03/24/2022 1206   ALT 11 03/24/2022 1206   BILITOT 0.4 03/24/2022 1206       RADIOGRAPHIC STUDIES: I have personally reviewed the radiological images as listed and agreed with the findings in the report. No results found.

## 2023-03-28 ENCOUNTER — Inpatient Hospital Stay: Payer: 59 | Admitting: Nurse Practitioner

## 2023-03-28 DIAGNOSIS — D649 Anemia, unspecified: Secondary | ICD-10-CM
